# Patient Record
Sex: Male | Born: 1965 | Race: White | Hispanic: No | Marital: Single | Smoking: Former smoker
Health system: Southern US, Community
[De-identification: ages and names within clinical notes are randomized; demographics above are authoritative.]

## PROBLEM LIST (undated history)

## (undated) DIAGNOSIS — K219 Gastro-esophageal reflux disease without esophagitis: Secondary | ICD-10-CM

## (undated) DIAGNOSIS — K56609 Unspecified intestinal obstruction, unspecified as to partial versus complete obstruction: Secondary | ICD-10-CM

## (undated) DIAGNOSIS — G4733 Obstructive sleep apnea (adult) (pediatric): Secondary | ICD-10-CM

## (undated) DIAGNOSIS — E781 Pure hyperglyceridemia: Secondary | ICD-10-CM

## (undated) DIAGNOSIS — K529 Noninfective gastroenteritis and colitis, unspecified: Secondary | ICD-10-CM

## (undated) DIAGNOSIS — I10 Essential (primary) hypertension: Secondary | ICD-10-CM

## (undated) DIAGNOSIS — Z9989 Dependence on other enabling machines and devices: Secondary | ICD-10-CM

## (undated) DIAGNOSIS — E119 Type 2 diabetes mellitus without complications: Secondary | ICD-10-CM

## (undated) DIAGNOSIS — J45909 Unspecified asthma, uncomplicated: Secondary | ICD-10-CM

## (undated) DIAGNOSIS — M1990 Unspecified osteoarthritis, unspecified site: Secondary | ICD-10-CM

## (undated) HISTORY — PX: HERNIA REPAIR: SHX51

## (undated) HISTORY — PX: LAPAROSCOPIC INCISIONAL / UMBILICAL / VENTRAL HERNIA REPAIR: SUR789

## (undated) HISTORY — PX: LIPOMA EXCISION: SHX5283

## (undated) HISTORY — DX: Unspecified intestinal obstruction, unspecified as to partial versus complete obstruction: K56.609

## (undated) HISTORY — DX: Pure hyperglyceridemia: E78.1

## (undated) HISTORY — DX: Essential (primary) hypertension: I10

## (undated) HISTORY — DX: Gastro-esophageal reflux disease without esophagitis: K21.9

## (undated) HISTORY — DX: Noninfective gastroenteritis and colitis, unspecified: K52.9

## (undated) HISTORY — DX: Morbid (severe) obesity due to excess calories: E66.01

## (undated) HISTORY — PX: WISDOM TOOTH EXTRACTION: SHX21

---

## 1999-01-10 ENCOUNTER — Ambulatory Visit: Admission: RE | Admit: 1999-01-10 | Discharge: 1999-01-10 | Payer: Self-pay | Admitting: Pulmonary Disease

## 1999-11-10 ENCOUNTER — Ambulatory Visit: Admission: RE | Admit: 1999-11-10 | Discharge: 1999-11-10 | Payer: Self-pay | Admitting: Pulmonary Disease

## 2003-07-27 HISTORY — PX: LAPAROSCOPIC CHOLECYSTECTOMY: SUR755

## 2003-08-03 ENCOUNTER — Emergency Department (HOSPITAL_COMMUNITY): Admission: EM | Admit: 2003-08-03 | Discharge: 2003-08-03 | Payer: Self-pay | Admitting: Emergency Medicine

## 2003-08-19 ENCOUNTER — Observation Stay (HOSPITAL_COMMUNITY): Admission: RE | Admit: 2003-08-19 | Discharge: 2003-08-20 | Payer: Self-pay | Admitting: *Deleted

## 2003-08-19 ENCOUNTER — Encounter (INDEPENDENT_AMBULATORY_CARE_PROVIDER_SITE_OTHER): Payer: Self-pay | Admitting: Specialist

## 2003-10-07 ENCOUNTER — Emergency Department (HOSPITAL_COMMUNITY): Admission: AD | Admit: 2003-10-07 | Discharge: 2003-10-07 | Payer: Self-pay | Admitting: Family Medicine

## 2004-11-18 ENCOUNTER — Ambulatory Visit: Payer: Self-pay | Admitting: Internal Medicine

## 2005-07-21 ENCOUNTER — Ambulatory Visit: Payer: Self-pay | Admitting: Internal Medicine

## 2005-09-28 ENCOUNTER — Ambulatory Visit: Payer: Self-pay | Admitting: Internal Medicine

## 2005-11-15 ENCOUNTER — Emergency Department (HOSPITAL_COMMUNITY): Admission: EM | Admit: 2005-11-15 | Discharge: 2005-11-15 | Payer: Self-pay | Admitting: Family Medicine

## 2006-01-31 ENCOUNTER — Emergency Department (HOSPITAL_COMMUNITY): Admission: EM | Admit: 2006-01-31 | Discharge: 2006-01-31 | Payer: Self-pay | Admitting: Emergency Medicine

## 2006-04-05 ENCOUNTER — Ambulatory Visit: Payer: Self-pay | Admitting: Internal Medicine

## 2006-05-09 ENCOUNTER — Emergency Department (HOSPITAL_COMMUNITY): Admission: EM | Admit: 2006-05-09 | Discharge: 2006-05-09 | Payer: Self-pay | Admitting: Family Medicine

## 2007-07-31 ENCOUNTER — Telehealth (INDEPENDENT_AMBULATORY_CARE_PROVIDER_SITE_OTHER): Payer: Self-pay | Admitting: *Deleted

## 2007-08-23 ENCOUNTER — Ambulatory Visit: Payer: Self-pay | Admitting: Internal Medicine

## 2007-08-23 DIAGNOSIS — I10 Essential (primary) hypertension: Secondary | ICD-10-CM

## 2007-08-23 LAB — CONVERTED CEMR LAB
Bilirubin Urine: NEGATIVE
Blood in Urine, dipstick: NEGATIVE
Glucose, Urine, Semiquant: NEGATIVE
Ketones, urine, test strip: NEGATIVE
Nitrite: NEGATIVE
Protein, U semiquant: NEGATIVE
Specific Gravity, Urine: 1.025
Urobilinogen, UA: NEGATIVE
WBC Urine, dipstick: NEGATIVE
pH: 6

## 2007-08-24 LAB — CONVERTED CEMR LAB
BUN: 11 mg/dL (ref 6–23)
Creatinine, Ser: 1 mg/dL (ref 0.4–1.5)
PSA: 0.67 ng/mL (ref 0.10–4.00)
Potassium: 4.3 meq/L (ref 3.5–5.1)

## 2007-08-25 ENCOUNTER — Encounter (INDEPENDENT_AMBULATORY_CARE_PROVIDER_SITE_OTHER): Payer: Self-pay | Admitting: *Deleted

## 2007-09-15 ENCOUNTER — Inpatient Hospital Stay (HOSPITAL_COMMUNITY): Admission: EM | Admit: 2007-09-15 | Discharge: 2007-09-17 | Payer: Self-pay | Admitting: Emergency Medicine

## 2007-09-15 ENCOUNTER — Telehealth (INDEPENDENT_AMBULATORY_CARE_PROVIDER_SITE_OTHER): Payer: Self-pay | Admitting: *Deleted

## 2007-09-15 ENCOUNTER — Ambulatory Visit: Payer: Self-pay | Admitting: Internal Medicine

## 2007-09-19 ENCOUNTER — Encounter (HOSPITAL_COMMUNITY): Admission: RE | Admit: 2007-09-19 | Discharge: 2007-10-03 | Payer: Self-pay | Admitting: *Deleted

## 2008-10-08 ENCOUNTER — Ambulatory Visit: Payer: Self-pay | Admitting: Internal Medicine

## 2008-10-08 DIAGNOSIS — R351 Nocturia: Secondary | ICD-10-CM | POA: Insufficient documentation

## 2008-10-08 DIAGNOSIS — E8881 Metabolic syndrome: Secondary | ICD-10-CM

## 2008-10-08 DIAGNOSIS — K5289 Other specified noninfective gastroenteritis and colitis: Secondary | ICD-10-CM

## 2008-10-08 LAB — CONVERTED CEMR LAB
Bilirubin Urine: NEGATIVE
Blood in Urine, dipstick: NEGATIVE
Glucose, Urine, Semiquant: NEGATIVE
Ketones, urine, test strip: NEGATIVE
Nitrite: NEGATIVE
Protein, U semiquant: NEGATIVE
Specific Gravity, Urine: 1.015
Urobilinogen, UA: 0.2
WBC Urine, dipstick: NEGATIVE
pH: 6

## 2009-02-06 ENCOUNTER — Telehealth (INDEPENDENT_AMBULATORY_CARE_PROVIDER_SITE_OTHER): Payer: Self-pay | Admitting: *Deleted

## 2009-02-07 ENCOUNTER — Ambulatory Visit: Payer: Self-pay | Admitting: Internal Medicine

## 2009-02-07 ENCOUNTER — Encounter (INDEPENDENT_AMBULATORY_CARE_PROVIDER_SITE_OTHER): Payer: Self-pay | Admitting: *Deleted

## 2009-02-07 DIAGNOSIS — N453 Epididymo-orchitis: Secondary | ICD-10-CM | POA: Insufficient documentation

## 2009-07-22 ENCOUNTER — Ambulatory Visit: Payer: Self-pay | Admitting: Internal Medicine

## 2009-07-22 ENCOUNTER — Encounter (INDEPENDENT_AMBULATORY_CARE_PROVIDER_SITE_OTHER): Payer: Self-pay | Admitting: *Deleted

## 2009-07-22 ENCOUNTER — Telehealth: Payer: Self-pay | Admitting: Internal Medicine

## 2009-07-22 DIAGNOSIS — R1033 Periumbilical pain: Secondary | ICD-10-CM | POA: Insufficient documentation

## 2009-07-22 DIAGNOSIS — K219 Gastro-esophageal reflux disease without esophagitis: Secondary | ICD-10-CM

## 2009-07-22 LAB — CONVERTED CEMR LAB
Bilirubin Urine: NEGATIVE
Blood in Urine, dipstick: NEGATIVE
Glucose, Urine, Semiquant: NEGATIVE
Ketones, urine, test strip: NEGATIVE
Nitrite: NEGATIVE
Protein, U semiquant: NEGATIVE
Specific Gravity, Urine: 1.01
Urobilinogen, UA: 0.2
WBC Urine, dipstick: NEGATIVE
pH: 6.5

## 2009-07-23 LAB — CONVERTED CEMR LAB
ALT: 33 units/L (ref 0–53)
AST: 19 units/L (ref 0–37)
Albumin: 3.8 g/dL (ref 3.5–5.2)
Alkaline Phosphatase: 58 units/L (ref 39–117)
Amylase: 24 units/L — ABNORMAL LOW (ref 27–131)
BUN: 10 mg/dL (ref 6–23)
Basophils Absolute: 0 10*3/uL (ref 0.0–0.1)
Basophils Relative: 0.4 % (ref 0.0–3.0)
Bilirubin, Direct: 0.1 mg/dL (ref 0.0–0.3)
Creatinine, Ser: 0.8 mg/dL (ref 0.4–1.5)
Eosinophils Absolute: 0.2 10*3/uL (ref 0.0–0.7)
Eosinophils Relative: 3.6 % (ref 0.0–5.0)
HCT: 45.9 % (ref 39.0–52.0)
Hemoglobin: 15.2 g/dL (ref 13.0–17.0)
Hgb A1c MFr Bld: 6.1 % (ref 4.6–6.5)
Lipase: 5 units/L — ABNORMAL LOW (ref 11.0–59.0)
Lymphocytes Relative: 31.3 % (ref 12.0–46.0)
Lymphs Abs: 2 10*3/uL (ref 0.7–4.0)
MCHC: 33.1 g/dL (ref 30.0–36.0)
MCV: 90 fL (ref 78.0–100.0)
Monocytes Absolute: 0.7 10*3/uL (ref 0.1–1.0)
Monocytes Relative: 11.1 % (ref 3.0–12.0)
Neutro Abs: 3.6 10*3/uL (ref 1.4–7.7)
Neutrophils Relative %: 53.6 % (ref 43.0–77.0)
Platelets: 190 10*3/uL (ref 150.0–400.0)
Potassium: 3.7 meq/L (ref 3.5–5.1)
RBC: 5.1 M/uL (ref 4.22–5.81)
RDW: 12.2 % (ref 11.5–14.6)
Total Bilirubin: 0.8 mg/dL (ref 0.3–1.2)
Total Protein: 6.8 g/dL (ref 6.0–8.3)
WBC: 6.5 10*3/uL (ref 4.5–10.5)

## 2009-07-24 ENCOUNTER — Encounter (INDEPENDENT_AMBULATORY_CARE_PROVIDER_SITE_OTHER): Payer: Self-pay | Admitting: *Deleted

## 2009-07-30 ENCOUNTER — Ambulatory Visit: Payer: Self-pay | Admitting: Internal Medicine

## 2009-07-30 LAB — CONVERTED CEMR LAB
OCCULT 1: NEGATIVE
OCCULT 2: NEGATIVE
OCCULT 3: NEGATIVE

## 2009-07-31 ENCOUNTER — Encounter (INDEPENDENT_AMBULATORY_CARE_PROVIDER_SITE_OTHER): Payer: Self-pay | Admitting: *Deleted

## 2009-09-19 ENCOUNTER — Ambulatory Visit: Payer: Self-pay | Admitting: Family Medicine

## 2009-09-22 LAB — CONVERTED CEMR LAB
ALT: 33 units/L (ref 0–53)
AST: 16 units/L (ref 0–37)
Albumin: 4 g/dL (ref 3.5–5.2)
Alkaline Phosphatase: 64 units/L (ref 39–117)
Amylase: 21 units/L (ref 0–105)
BUN: 11 mg/dL (ref 6–23)
Basophils Absolute: 0 10*3/uL (ref 0.0–0.1)
Basophils Relative: 0 % (ref 0–1)
CO2: 22 meq/L (ref 19–32)
Calcium: 8.6 mg/dL (ref 8.4–10.5)
Chloride: 104 meq/L (ref 96–112)
Creatinine, Ser: 0.96 mg/dL (ref 0.40–1.50)
Eosinophils Absolute: 0.3 10*3/uL (ref 0.0–0.7)
Eosinophils Relative: 5 % (ref 0–5)
Glucose, Bld: 146 mg/dL — ABNORMAL HIGH (ref 70–99)
HCT: 44.9 % (ref 39.0–52.0)
Hemoglobin: 14.7 g/dL (ref 13.0–17.0)
Lipase: 13 units/L (ref 0–75)
Lymphocytes Relative: 26 % (ref 12–46)
Lymphs Abs: 1.8 10*3/uL (ref 0.7–4.0)
MCHC: 32.7 g/dL (ref 30.0–36.0)
MCV: 88.7 fL (ref 78.0–100.0)
Monocytes Absolute: 0.4 10*3/uL (ref 0.1–1.0)
Monocytes Relative: 6 % (ref 3–12)
Neutro Abs: 4.2 10*3/uL (ref 1.7–7.7)
Neutrophils Relative %: 62 % (ref 43–77)
Platelets: 192 10*3/uL (ref 150–400)
Potassium: 3.7 meq/L (ref 3.5–5.3)
RBC: 5.06 M/uL (ref 4.22–5.81)
RDW: 13.3 % (ref 11.5–15.5)
Sodium: 139 meq/L (ref 135–145)
Total Bilirubin: 0.5 mg/dL (ref 0.3–1.2)
Total Protein: 6.5 g/dL (ref 6.0–8.3)
WBC: 6.8 10*3/uL (ref 4.0–10.5)

## 2009-11-10 ENCOUNTER — Ambulatory Visit: Payer: Self-pay | Admitting: Family Medicine

## 2009-11-11 ENCOUNTER — Ambulatory Visit: Payer: Self-pay | Admitting: Cardiovascular Disease

## 2009-11-11 ENCOUNTER — Telehealth (INDEPENDENT_AMBULATORY_CARE_PROVIDER_SITE_OTHER): Payer: Self-pay | Admitting: *Deleted

## 2009-11-11 DIAGNOSIS — K429 Umbilical hernia without obstruction or gangrene: Secondary | ICD-10-CM

## 2009-11-11 DIAGNOSIS — K4090 Unilateral inguinal hernia, without obstruction or gangrene, not specified as recurrent: Secondary | ICD-10-CM | POA: Insufficient documentation

## 2009-11-11 LAB — CONVERTED CEMR LAB
ALT: 30 units/L (ref 0–53)
AST: 19 units/L (ref 0–37)
Albumin: 4 g/dL (ref 3.5–5.2)
Alkaline Phosphatase: 59 units/L (ref 39–117)
Amylase: 38 units/L (ref 27–131)
BUN: 12 mg/dL (ref 6–23)
Basophils Absolute: 0 10*3/uL (ref 0.0–0.1)
Basophils Relative: 0.2 % (ref 0.0–3.0)
Bilirubin, Direct: 0.1 mg/dL (ref 0.0–0.3)
CO2: 29 meq/L (ref 19–32)
Calcium: 9 mg/dL (ref 8.4–10.5)
Chloride: 104 meq/L (ref 96–112)
Creatinine, Ser: 0.9 mg/dL (ref 0.4–1.5)
Eosinophils Absolute: 0.3 10*3/uL (ref 0.0–0.7)
Eosinophils Relative: 3.9 % (ref 0.0–5.0)
GFR calc non Af Amer: 98.38 mL/min (ref >60)
Glucose, Bld: 97 mg/dL (ref 70–99)
H Pylori IgG: NEGATIVE
HCT: 45.3 % (ref 39.0–52.0)
Hemoglobin: 15.5 g/dL (ref 13.0–17.0)
Lipase: 15 units/L (ref 11.0–59.0)
Lymphocytes Relative: 26.6 % (ref 12.0–46.0)
Lymphs Abs: 2.3 10*3/uL (ref 0.7–4.0)
MCHC: 34.3 g/dL (ref 30.0–36.0)
MCV: 88.6 fL (ref 78.0–100.0)
Monocytes Absolute: 0.3 10*3/uL (ref 0.1–1.0)
Monocytes Relative: 2.9 % — ABNORMAL LOW (ref 3.0–12.0)
Neutro Abs: 5.8 10*3/uL (ref 1.4–7.7)
Neutrophils Relative %: 66.4 % (ref 43.0–77.0)
Platelets: 189 10*3/uL (ref 150.0–400.0)
Potassium: 4.1 meq/L (ref 3.5–5.1)
RBC: 5.11 M/uL (ref 4.22–5.81)
RDW: 13.1 % (ref 11.5–14.6)
Sodium: 141 meq/L (ref 135–145)
TSH: 2.9 microintl units/mL (ref 0.35–5.50)
Total Bilirubin: 0.5 mg/dL (ref 0.3–1.2)
Total Protein: 6.9 g/dL (ref 6.0–8.3)
WBC: 8.8 10*3/uL (ref 4.5–10.5)

## 2009-11-12 ENCOUNTER — Encounter: Payer: Self-pay | Admitting: Family Medicine

## 2009-11-12 ENCOUNTER — Telehealth: Payer: Self-pay | Admitting: Family Medicine

## 2009-11-21 ENCOUNTER — Encounter: Payer: Self-pay | Admitting: Family Medicine

## 2009-12-23 ENCOUNTER — Encounter: Payer: Self-pay | Admitting: Family Medicine

## 2010-06-12 ENCOUNTER — Ambulatory Visit: Payer: Self-pay | Admitting: Internal Medicine

## 2010-08-25 NOTE — Letter (Signed)
Summary: Out of Work  [facility] [employer]  [address] [place], [place] [address]   Phone: [phone]  Fax: [phone]    November 10, 2009   Employee:  Scott Avery [facility]    To Whom It May Concern:   For Medical reasons, please excuse the above named employee from work for the following dates:  Start:   11/10/2009  End:   11/11/2009  If you need additional information, please feel free to contact our office.         Sincerely,    Loreen Freud DO

## 2010-08-25 NOTE — Assessment & Plan Note (Signed)
Summary: abdoninal pain/cbs   Vital Signs:  Patient profile:   45 year old male Height:      67 inches Weight:      345 pounds BMI:     54.23 Pulse rate:   86 / minute Pulse rhythm:   regular BP sitting:   146 / 82  (left arm) Cuff size:   large  Vitals Entered By: Army Fossa CMA (November 10, 2009 2:36 PM) CC: Pt here for abdominal pain- bloating, vomitted twice, having diarrhea. Pt was seen for the same thing in Feb., Abdominal Pain   History of Present Illness:       This is a 45 year old man who presents with Abdominal Pain.  The symptoms began 3 days ago.  Pt here c/o return of abd pain and having N/V/D for 3 days.  Pt on omeprazole two times a day ---no relief.    "Greens"  seem to make it worse. Pork also seems to make it worse.   Pt no longer has a GB.   .  The patient reports vomiting and diarrhea, but denies constipation, melena, hematochezia, anorexia, and hematemesis.  The location of the pain is epigastric.  The pain is described as intermittent and cramping in quality.  The patient denies the following symptoms: fever, weight loss, dysuria, chest pain, jaundice, dark urine, missed menstrual period, and vaginal bleeding.  The pain is worse with food.  This is the 3rd visit for the same thing.     Current Medications (verified): 1)  Labetalol Hcl 300 Mg Tabs (Labetalol Hcl) .Marland Kitchen.. 1 By Mouth Two Times A Day 2)  Prilosec Otc 20 Mg  Tbec (Omeprazole Magnesium) .... 2 Tabs By Mouth Daily 3)  Amlodipine Besylate 5 Mg Tabs (Amlodipine Besylate) .Marland Kitchen.. 1 Once Daily 4)  Clidinium-Chlordiazepoxide 2.5-5 Mg Caps (Clidinium-Chlordiazepoxide) .Marland Kitchen.. 1 Q 6 Hrs As Needed Pain 5)  Promethazine Hcl 25 Mg Tabs (Promethazine Hcl) .Marland Kitchen.. 1 By Mouth Qid As Needed  Allergies (verified): No Known Drug Allergies  Past History:  Past Medical History: Last updated: 07/22/2009 Metabolic Syndrome  GERD Hypertension  Past Surgical History: Last updated: 07/22/2009 Cholecystectomy  Family  History: Last updated: 07/22/2009 PGF prostate CA; no FH of GI disease  Social History: Last updated: 07/22/2009 Divorced Current Smoker: 1/2 ppd Alcohol use-yes: rarely Regular exercise-no Occupation: Drive Mining engineer  Risk Factors: Exercise: no (07/22/2009)  Risk Factors: Smoking Status: current (07/22/2009)  Family History: Reviewed history from 07/22/2009 and no changes required. PGF prostate CA; no FH of GI disease  Social History: Reviewed history from 07/22/2009 and no changes required. Divorced Current Smoker: 1/2 ppd Alcohol use-yes: rarely Regular exercise-no Occupation: Drive Mining engineer  Review of Systems      See HPI  Physical Exam  General:  Well-developed,well-nourished,in no acute distress; alert,appropriate and cooperative throughout examination Mouth:  pharynx pink and moist.   Lungs:  Normal respiratory effort, chest expands symmetrically. Lungs are clear to auscultation, no crackles or wheezes. Heart:  normal rate and no murmur.   Abdomen:  + periumbilical tenderness + umbilical hernia and ventral hernia soft, normal bowel sounds, no distention, no masses, no guarding, and no rebound tenderness.   Psych:  Oriented X3 and normally interactive.     Impression & Recommendations:  Problem # 1:  ABDOMINAL PAIN, PERIUMBILICAL (ICD-789.05) phenergan for nausea/ vomiting go to er if symptoms worsen tonight will get CT con't omeprazole Orders: Venipuncture (73710) TLB-BMP (Basic Metabolic Panel-BMET) (80048-METABOL) TLB-CBC Platelet - w/Differential (85025-CBCD)  TLB-Hepatic/Liver Function Pnl (80076-HEPATIC) TLB-TSH (Thyroid Stimulating Hormone) (84443-TSH) TLB-Amylase (82150-AMYL) TLB-Lipase (83690-LIPASE) TLB-H. Pylori Abs(Helicobacter Pylori) (86677-HELICO) Radiology Referral (Radiology)  Discussed symptom control with the patient.   Complete Medication List: 1)  Labetalol Hcl 300 Mg Tabs (Labetalol hcl) .Marland Kitchen.. 1 by mouth two  times a day 2)  Prilosec Otc 20 Mg Tbec (Omeprazole magnesium) .... 2 tabs by mouth daily 3)  Amlodipine Besylate 5 Mg Tabs (Amlodipine besylate) .Marland Kitchen.. 1 once daily 4)  Clidinium-chlordiazepoxide 2.5-5 Mg Caps (Clidinium-chlordiazepoxide) .Marland Kitchen.. 1 q 6 hrs as needed pain 5)  Promethazine Hcl 25 Mg Tabs (Promethazine hcl) .Marland Kitchen.. 1 by mouth qid as needed Prescriptions: PROMETHAZINE HCL 25 MG TABS (PROMETHAZINE HCL) 1 by mouth qid as needed  #30 x 0   Entered and Authorized by:   Loreen Freud DO   Signed by:   Loreen Freud DO on 11/10/2009   Method used:   [phone][facility] (retail[facility]       [address]       [place], [place]  [address]       Ph: [phone] or [phone]       Fax: [phone]   RxID:   [id number]

## 2010-08-25 NOTE — Assessment & Plan Note (Signed)
Summary: congested/cbs   Vital Signs:  Patient profile:   45 year old male Weight:      348.4 pounds BMI:     54.76 Temp:     98.7 degrees F oral Pulse rate:   76 / minute Resp:     15 per minute BP sitting:   148 / 88  (left arm) Cuff size:   large  Vitals Entered By: Shonna Chock CMA (June 12, 2010 3:33 PM) CC: Congested, fatigue, sinus pressure, stuffy, and left ear concerns since tuesday , URI symptoms   Primary Care [facility]:  Marga Melnick MD  CC:  Congested, fatigue, sinus pressure, stuffy, and left ear concerns since tuesday , and URI symptoms.  History of Present Illness:  RTI Symptoms      This is a 45 year old man who presents with RTI  symptoms ; onset  06/08/2010 as  myalgias & fatigue.  The patient reports nasal congestion, purulent nasal discharge, and dry cough, but denies sore throat and earache.  Associated symptoms include low-grade fever (<100.5 degrees).  The patient denies dyspnea and wheezing.   The patient denies headache.  Risk factors for Strep sinusitis include bilateral facial pain.  The patient denies the following risk factors for Strep sinusitis: tooth pain and tender adenopathy.  Rx: Mucinex DM  Current Medications (verified): 1)  Labetalol Hcl 300 Mg Tabs (Labetalol Hcl) .Marland Kitchen.. 1 By Mouth Two Times A Day 2)  Prilosec Otc 20 Mg  Tbec (Omeprazole Magnesium) .Marland Kitchen.. 1 Tabs By Mouth Daily 3)  Amlodipine Besylate 5 Mg Tabs (Amlodipine Besylate) .Marland Kitchen.. 1 Once Daily  Allergies (verified): No Known Drug Allergies  Physical Exam  General:  in no acute distress; alert,appropriate and cooperative throughout examination Ears:  External ear exam shows no significant lesions or deformities.  Otoscopic examination reveals clear canals, tympanic membranes are intact bilaterally without bulging, retraction, inflammation or discharge. Hearing is grossly normal bilaterally. Nose:  External nasal examination shows no deformity or inflammation. Nasal mucosa are   erythematous  without lesions or exudates. Septum to R . Hyponasal  Mouth:  Oral mucosa and oropharynx without lesions or exudates.  Teeth in good repair. Marked pharyngeal erythema.   Lungs:  Normal respiratory effort, chest expands symmetrically. Lungs are clear to auscultation, no crackles or wheezes. Cervical Nodes:  No lymphadenopathy noted Axillary Nodes:  No palpable lymphadenopathy   Impression & Recommendations:  Problem # 1:  SINUSITIS- ACUTE-NOS (ICD-461.9)  His updated medication list for this problem includes:    Amoxicillin 500 Mg Caps (Amoxicillin) .Marland Kitchen... 1 three times a day  Complete Medication List: 1)  Labetalol Hcl 300 Mg Tabs (Labetalol hcl) .Marland Kitchen.. 1 by mouth two times a day 2)  Prilosec Otc 20 Mg Tbec (Omeprazole magnesium) .Marland Kitchen.. 1 tabs by mouth daily 3)  Amlodipine Besylate 5 Mg Tabs (Amlodipine besylate) .Marland Kitchen.. 1 once daily 4)  Amoxicillin 500 Mg Caps (Amoxicillin) .Marland Kitchen.. 1 three times a day  Patient Instructions: 1)  Neti pot once daily - two times a day as needed for congestion. 2)  Drink as much NON  dairy  fluid as you can tolerate for the next few days. Prescriptions: AMOXICILLIN 500 MG CAPS (AMOXICILLIN) 1 three times a day  #30 x 0   Entered and Authorized by:   Marga Melnick MD   Signed by:   Marga Melnick MD on 06/12/2010   Method used:   F[facility]ed to ...       [facility] [address] [phone][facility] (  retail)       [address]       [place], [place]  [address]       Ph: [phone] or [phone]       Fax: [phone]   RxID:   [id number]    Orders Added: 1)  Est. Patient Level III [34742]

## 2010-08-25 NOTE — Consult Note (Signed)
Summary: [facility]   Imported By: Lanelle Bal 11/28/2009 11:41:56  _____________________________________________________________________  External Attachment:    Type:   Image     Comment:   External Document

## 2010-08-25 NOTE — Letter (Signed)
Summary: [facility]   Imported By: Lanelle Bal 01/06/2010 14:00:31  _____________________________________________________________________  External Attachment:    Type:   Image     Comment:   External Document

## 2010-08-25 NOTE — Letter (Signed)
Summary: Work Dietitian at [employer]  [address] [place], [place] [address]   Phone: [phone]  Fax: [phone]    Today's Date: November 12, 2009  Name of Patient: Scott Avery  The above named patient had a medical visit today at:  am / pm.  Please take this into consideration when reviewing the time away from work/school.    Special Instructions:  [  ] None  [  ] To be off the remainder of today, returning to the normal work / school schedule tomorrow.  [  ] To be off until the next scheduled appointment on ______________________.  [ X ] Other  The above pt was diagnosed with a hernia and is unable to lift more than 10 lbs until he is seen by the surgeon.  Appointment is being made.   ________________________________________________________________________   Sincerely yours,   Loreen Freud DO

## 2010-08-25 NOTE — Progress Notes (Signed)
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 Fax is  [phone],  attn: Huntley Dec.  Initial call taken by: Harold Barban,  November 12, 2009 10:21 AM  Follow-up for Phone Call        printed Follow-up by: Loreen Freud DO,  November 12, 2009 11:18 AM  Additional Follow-up for Phone Call Additional follow up Details #1::        faxed. Army Fossa CMA  November 12, 2009 11:28 AM

## 2010-08-25 NOTE — Progress Notes (Signed)
Summary: results  Phone Note Outgoing Call Call back at Work Phone [phone] Call back at [phone]   Call placed by: Army Fossa CMA,  November 11, 2009 4:43 PM Reason for Call: Discuss lab or test results Summary of Call: Regarding CT results, LMTCB:  + small hernia--- refer to surgery for evaluation  Follow-up for Phone Call        Pt is aware of referral. Army Fossa CMA  November 11, 2009 5:02 PM     Additional Follow-up for Phone Call Additional follow up Details #2::    Patient is requesting a call back today. Follow-up by: Barb Merino,  November 11, 2009 4:57 PM

## 2010-08-25 NOTE — Letter (Signed)
Summary: Out of Work  [facility]  [address]   [place], [place] [address]   Phone: [phone]  Fax: [phone]    September 19, 2009   Employee:  Scott Avery [facility]    To Whom It May Concern:   For Medical reasons, please excuse the above named employee from work for the following dates:  Start:   Feb 23rd- 24th  End:   Feb 25th  If you need additional information, please feel free to contact our office.         Sincerely,    Seymour Bars DO

## 2010-08-25 NOTE — Assessment & Plan Note (Signed)
Summary: abd pain   Vital Signs:  Patient profile:   45 year old male old male Weight:      343 pounds Pulse rate:   79 / minute BP sitting:   122 / 71  (left arm) Cuff size:   large  Vitals Entered By: Kathlene November (September 19, 2009 8:15 AM) CC: abdominal bloating, cramping, diarrhea and vomiting since Tuesday   Primary Care Provider:  Marga Melnick MD  CC:  abdominal bloating, cramping, and diarrhea and vomiting since Tuesday.  History of Present Illness: 45 yo WM presents for problems with abdominal bloating, cramping, diarrhea, nausea and vomitting that started 3 days ago.  He is starting to get better.  The same thing happened in Dec and he saw Dr Alwyn Ren.  He reports that his diagnosis was pancreatitis then.  He has been trying to lose weight and eat healthier but over the weekend, he was watching Nascar race and has chili beans.    He took one of the pills that Dr Alwyn Ren gave him and he has not been eating.  He has been drinking plenty of fluids.  He started a bland diet last night.  No fevers or chills.  His urine was dark colored for 2 days but has returned to normal.    Current Medications (verified): 1)  Labetalol Hcl 300 Mg Tabs (Labetalol Hcl) .Marland Kitchen.. 1 By Mouth Two Times A Day 2)  Prilosec Otc 20 Mg  Tbec (Omeprazole Magnesium) .Marland Kitchen.. 1 By Mouth Qod 3)  Amlodipine Besylate 5 Mg Tabs (Amlodipine Besylate) .Marland Kitchen.. 1 Once Daily 4)  Clidinium-Chlordiazepoxide 2.5-5 Mg Caps (Clidinium-Chlordiazepoxide) .Marland Kitchen.. 1 Q 6 Hrs As Needed Pain  Allergies (verified): No Known Drug Allergies  Comments:  Nurse/Medical Assistant: The patient's medications and allergies were reviewed with the patient and were updated in the Medication and Allergy Lists. Kathlene November (September 19, 2009 8:16 AM)  Past History:  Past Medical History: Reviewed history from 07/22/2009 and no changes required. Metabolic Syndrome  GERD Hypertension  Past Surgical History: Reviewed history from 07/22/2009 and no  changes required. Cholecystectomy  Social History: Reviewed history from 07/22/2009 and no changes required. Divorced Current Smoker: 1/2 ppd Alcohol use-yes: rarely Regular exercise-no Occupation: Drive Mining engineer  Review of Systems      See HPI  Physical Exam  General:  alert, well-developed, well-nourished, and well-hydrated.  obese in NAD Head:  normocephalic and atraumatic.   Eyes:  conjunctiva clear, non icteric Nose:  no nasal discharge.   Mouth:  pharynx pink and moist.   Neck:  no masses.   Lungs:  Normal respiratory effort, chest expands symmetrically. Lungs are clear to auscultation, no crackles or wheezes. Heart:  Normal rate and regular rhythm. S1 and S2 normal without gallop, murmur, click, rub. S4 Abdomen:  soft with reducible umbilical hernia. periumbilical TTP with vol guarding.  No HSM.  NABS.  No rigidity Extremities:  no LE edema Skin:  color normal.  no jaundice or pallor Cervical Nodes:  No lymphadenopathy noted Psych:  good eye contact, not anxious appearing, and not depressed appearing.     Impression & Recommendations:  Problem # 1:  ABDOMINAL PAIN, PERIUMBILICAL (ICD-789.05) Periumbilical pain with N/V/D x 2 days that is improving.  Similar episode in Dec with workup suspicious for pancreatitis.  DDX includes gastroenteritis, PUD, pancreatitis, biliary colic.  Hemodynamically stable today and not in any acute pain.  I did RF his meds given by Dr Alwyn Ren.   Will obtain labs today to look for  underlying cause.  Bland diet, clear fluids.  Call if not improving by Monday.   Orders: T-Comprehensive Metabolic Panel [phone]) T-CBC w/Diff [phone]) T-Amylase [phone]) T-Lipase [phone])  Complete Medication List: 1)  Labetalol Hcl 300 Mg Tabs (Labetalol hcl) .Marland Kitchen.. 1 by mouth two times a day 2)  Prilosec Otc 20 Mg Tbec (Omeprazole magnesium) .... 2 tabs by mouth daily 3)  Amlodipine Besylate 5 Mg Tabs (Amlodipine besylate) .Marland Kitchen.. 1 once  daily 4)  Clidinium-chlordiazepoxide 2.5-5 Mg Caps (Clidinium-chlordiazepoxide) .Marland Kitchen.. 1 q 6 hrs as needed pain  Patient Instructions: 1)  Labs downstairs today. 2)  Will call you w/ results on Monday. 3)  Stick to a bland diet, plenty of clear fluids. 4)  Use RX meds as needed. 5)  F/U with Dr Alwyn Ren if you get any recurrences.   Prescriptions: CLIDINIUM-CHLORDIAZEPOXIDE 2.5-5 MG CAPS (CLIDINIUM-CHLORDIAZEPOXIDE) 1 q 6 hrs as needed pain  #30 x 0   Entered and Authorized by:   Seymour Bars DO   Signed by:   Seymour Bars DO on 09/19/2009   Method used:   [facility]        [facility] [phone][facility] (retail)       [address]       [place], [place]  [address]       Ph: [phone] or [phone]       Fax: [phone]   RxID:   [id number]

## 2010-08-25 NOTE — Letter (Signed)
Summary: Results Follow up Letter  [facility]  [address] [place], [place] [address]   Phone: [phone]  Fax: [phone]    07/31/2009 MRN: [id number]  EVERTTE SONES [address] [place], [place]  [address]  Dear Mr. Straker,  The following are the results of your recent test(s):  Test         Result    Pap Smear:        Normal _____  Not Normal _____ Comments: ______________________________________________________ Cholesterol: LDL(Bad cholesterol):         Your goal is less than:         HDL (Good cholesterol):       Your goal is more than: Comments:  ______________________________________________________ Mammogram:        Normal _____  Not Normal _____ Comments:  ___________________________________________________________________ Hemoccult:        Normal _X____  Not normal _______ Comments:    _____________________________________________________________________ Other Tests:    We routinely do not discuss normal results over the telephone.  If you desire a copy of the results, or you have any questions about this information we can discuss them at your next office visit.   Sincerely,

## 2010-08-27 ENCOUNTER — Other Ambulatory Visit: Payer: Self-pay | Admitting: Internal Medicine

## 2010-08-27 ENCOUNTER — Ambulatory Visit (INDEPENDENT_AMBULATORY_CARE_PROVIDER_SITE_OTHER): Payer: 59 | Admitting: Internal Medicine

## 2010-08-27 ENCOUNTER — Encounter: Payer: Self-pay | Admitting: Internal Medicine

## 2010-08-27 DIAGNOSIS — R509 Fever, unspecified: Secondary | ICD-10-CM

## 2010-08-27 DIAGNOSIS — M25559 Pain in unspecified hip: Secondary | ICD-10-CM

## 2010-08-27 LAB — CBC WITH DIFFERENTIAL/PLATELET
Eosinophils Absolute: 0.2 10*3/uL (ref 0.0–0.7)
Eosinophils Relative: 3 % (ref 0.0–5.0)
Lymphocytes Relative: 21.8 % (ref 12.0–46.0)
Lymphs Abs: 1.8 10*3/uL (ref 0.7–4.0)
MCHC: 34.7 g/dL (ref 30.0–36.0)
Monocytes Absolute: 0.5 10*3/uL (ref 0.1–1.0)
Monocytes Relative: 5.6 % (ref 3.0–12.0)
Neutro Abs: 5.7 10*3/uL (ref 1.4–7.7)
Neutrophils Relative %: 69.2 % (ref 43.0–77.0)
Platelets: 195 10*3/uL (ref 150.0–400.0)
WBC: 8.2 10*3/uL (ref 4.5–10.5)

## 2010-08-27 LAB — SEDIMENTATION RATE: Sed Rate: 9 mm/hr (ref 0–22)

## 2010-08-28 ENCOUNTER — Ambulatory Visit (HOSPITAL_COMMUNITY)
Admission: RE | Admit: 2010-08-28 | Discharge: 2010-08-28 | Disposition: A | Discharge status: Home / Self Care | Payer: 59 | Source: Ambulatory Visit | Attending: Internal Medicine | Admitting: Internal Medicine

## 2010-08-28 ENCOUNTER — Other Ambulatory Visit: Payer: Self-pay | Admitting: Internal Medicine

## 2010-08-28 DIAGNOSIS — M25551 Pain in right hip: Secondary | ICD-10-CM

## 2010-08-28 DIAGNOSIS — M76899 Other specified enthesopathies of unspecified lower limb, excluding foot: Secondary | ICD-10-CM | POA: Insufficient documentation

## 2010-08-28 DIAGNOSIS — M25559 Pain in unspecified hip: Secondary | ICD-10-CM | POA: Insufficient documentation

## 2010-08-28 DIAGNOSIS — I998 Other disorder of circulatory system: Secondary | ICD-10-CM | POA: Insufficient documentation

## 2010-08-31 ENCOUNTER — Telehealth: Payer: Self-pay | Admitting: Internal Medicine

## 2010-09-02 NOTE — Assessment & Plan Note (Signed)
Summary: Right side hip pain    Vital Signs:  Patient profile:   45 year old male Weight:      335 pounds BMI:     52.66 Temp:     99.2 degrees F oral Pulse rate:   72 / minute Resp:     15 per minute BP sitting:   130 / 88  (left arm) Cuff size:   large  Vitals Entered By: Shonna Chock CMA (August 27, 2010 11:45 AM) CC: Right side hip pain, Lower Extremity Joint pain   Primary Care Provider:  Marga Melnick MD  CC:  Right side hip pain and Lower Extremity Joint pain.  History of Present Illness: Lower Extremity Joint Pain      This is a 45 year old man who presents with Lower Extremity Joint pain.  The patient reports giving away and decreased ROM, but denies swelling, redness, locking, popping, and weakness.  The pain is located in the right hip.  The pain began suddenly and with no injury while walking @ work 08/26/2010.  The pain is described as sharp, intermittent, and activity related , with walking. Standing causes aching discomfort.  The patient denies the following symptoms: fever ( but he has been hot), rash, photosensitivity, eye symptoms, diarrhea, and dysuria.  He  had some nausea with the pain . 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 Rx: ibuprofen 800 mg two times a day 02/01 helped.PGM & P aunt had arthritis.  Current Medications (verified): 1)  Labetalol Hcl 300 Mg Tabs (Labetalol Hcl) .Marland Kitchen.. 1 By Mouth Two Times A Day 2)  Prilosec Otc 20 Mg  Tbec (Omeprazole Magnesium) .Marland Kitchen.. 1 Tabs By Mouth Daily 3)  Amlodipine Besylate 5 Mg Tabs (Amlodipine Besylate) .Marland Kitchen.. 1 Once Daily  Allergies (verified): No Known Drug Allergies  Review of Systems General:  Denies chills and sweats. ENT:  No purulence. Resp:  Denies cough and sputum productive. GU:  Denies discharge, hematuria, and incontinence. Neuro:  Denies brief paralysis, disturbances in coordination, numbness, tingling, and tremors; Itermittent imbalance.  Physical Exam  General:  in no acute distress;  alert,appropriate and cooperative throughout examination Eyes:  No corneal or conjunctival inflammation noted. EOMI. Perrla.  Msk:  No deformity or scoliosis noted of thoracic or lumbar spine.   Minimal asymmetry of thoracic muscles, R > L Pulses:  R and L dorsalis pedis and posterior tibial pulses are full and equal bilaterally Extremities:  No clubbing, cyanosis, edema, or deformity noted with normal full range of motion of all joints.  pain with medial rotation of R hip. Mild crepitus of knees  Neurologic:  alert & oriented X3, strength normal in all extremities, gait (heel/ toe)  normal, and DTRs symmetrical and normal.   Skin:  Intact without suspicious lesions or rashes Cervical Nodes:  No lymphadenopathy noted Axillary Nodes:  No palpable lymphadenopathy Psych:  memory intact for recent and remote, normally interactive, and good eye contact.     Impression & Recommendations:  Problem # 1:  HIP PAIN, RIGHT (ICD-719.45)  Orders: T-Hip Comp Right Min 2 views (73510TC) Venipuncture ([address]) TLB-CBC Platelet - w/Differential (85025-CBCD) TLB-Sedimentation Rate (ESR) (85652-ESR)  His updated medication list for this problem includes:    Tramadol Hcl 50 Mg Tabs (Tramadol hcl) .Marland Kitchen... 1 every 6 hrs as needed for pain  Problem # 2:  FEVER (ICD-780.60)  Orders: T-Hip Comp Right Min 2 views (73510TC) Venipuncture ([address]) TLB-CBC Platelet - w/Differential (85025-CBCD) TLB-Sedimentation Rate (ESR) (85652-ESR)  Complete Medication List: 1)  Labetalol Hcl 300 Mg Tabs (Labetalol hcl) .Marland Kitchen.. 1 by mouth two times a day 2)  Prilosec Otc 20 Mg Tbec (Omeprazole magnesium) .Marland Kitchen.. 1 tabs by mouth daily 3)  Amlodipine Besylate 5 Mg Tabs (Amlodipine besylate) .Marland Kitchen.. 1 once daily 4)  Tramadol Hcl 50 Mg Tabs (Tramadol hcl) .Marland Kitchen.. 1 every 6 hrs as needed for pain  Patient Instructions: 1)  Recommended remaining out of work for  02/02 & 02/03. Prescriptions: TRAMADOL HCL 50 MG TABS (TRAMADOL HCL) 1 every 6  hrs as needed for pain  #30 x 0   Entered and Authorized by:   Marga Melnick MD   Signed by:   Marga Melnick MD on 08/27/2010   Method used:   [facility]        [facility] [phone][facility] (retail)       [address]       [place], [place]  [address]       Ph: [phone] or [phone]       Fax: [phone]   RxID:   [id number]    Orders Added: 1)  Est. Patient Level IV [95638] 2)  T-Hip Comp Right Min 2 views [73510TC] 3)  Venipuncture [75643] 4)  TLB-CBC Platelet - w/Differential [85025-CBCD] 5)  TLB-Sedimentation Rate (ESR) [85652-ESR]

## 2010-09-10 NOTE — Progress Notes (Signed)
Summary: wants to hear about labs and xrays  Phone Note Call from Patient Call back at Home Phone [phone]   Caller: Patient Summary of Call: wants to here about xrays and labs results Initial call taken by: Jerolyn Shin,  August 31, 2010 2:28 PM  Follow-up for Phone Call        Patient notified. Follow-up by: Lucious Groves CMA,  August 31, 2010 2:44 PM

## 2010-10-31 ENCOUNTER — Other Ambulatory Visit: Payer: Self-pay | Admitting: Internal Medicine

## 2010-11-02 NOTE — Telephone Encounter (Signed)
Dr.Hopper please advise, med not on med list in old EMR system, not on inactive med list either. Last OV 08/27/10 (for hip pain)

## 2010-11-02 NOTE — Telephone Encounter (Deleted)
Dr.Hopper please advise, not on active or inactive med list in Old EMR system

## 2010-11-02 NOTE — Telephone Encounter (Signed)
OK X1 # 6

## 2010-12-01 ENCOUNTER — Telehealth: Payer: Self-pay | Admitting: Internal Medicine

## 2010-12-01 ENCOUNTER — Other Ambulatory Visit: Payer: Self-pay

## 2010-12-01 MED ORDER — LABETALOL HCL 300 MG PO TABS: 300.0000 mg | ORAL_TABLET | Freq: Two times a day (BID) | ORAL | Status: DC

## 2010-12-01 MED ORDER — AMLODIPINE BESYLATE 5 MG PO TABS: 5.0000 mg | ORAL_TABLET | Freq: Every day | ORAL | Status: DC

## 2010-12-01 NOTE — Telephone Encounter (Signed)
Please go to chart review, select med tab. Med was sent earlier

## 2010-12-01 NOTE — Telephone Encounter (Signed)
Patient needs refill for labetalol hcl 300 mg- amlopidine 20 mg - [facility] - he said they fax request yesterday

## 2010-12-08 NOTE — H&P (Signed)
NAMEMARCELL, CHAVARIN NO.:  [id number]   MEDICAL RECORD NO.:  [id number]          PATIENT TYPE:  INP   LOCATION:  5532                         FACILITY:  [facility]   PHYSICIAN:  Lowell Bouton, M.D.DATE OF BIRTH:  [birth year]   DATE OF ADMISSION:  09/15/2007  DATE OF DISCHARGE:                              HISTORY & PHYSICAL   CHIEF COMPLAINTS:  Pain and swelling, right ring finger and hand.   HISTORY OF PRESENT ILLNESS:  The patient is a 44 year old right-handed  male who was bitten by a stray cat the day prior to admission.  He went  to see Dr. Alwyn Ren at about 5:00 p.m. and was found to have cellulitis  and an inflamed ring finger PIP joint.  He presents now for evaluation.   PAST MEDICAL HISTORY:  Cholecystectomy.   CURRENT MEDICATIONS:  Labetalol and Prilosec as needed.   ALLERGIES:  HE HAS NO ALLERGIES.   FAMILY HISTORY:  Positive for diabetes and hypertension.   SOCIAL HISTORY:  He smokes a quarter pack of cigarettes a day.  He does  not drink.  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   REVIEW OF SYSTEMS:  Positive for hypertension.  Negative for diabetes  and thyroid disease.   PHYSICAL EXAMINATION:  GENERAL:  He is a well-developed, well-nourished  male in moderate pain.  HEENT:  Pupils are equal, round, and reactive to light.  Nasal septum is  midline.  Oropharynx clear.  External auditory canals are clear.  NECK:  No adenopathy or bruit.  CHEST:  Clear.  HEART:  Regular without murmur.  ABDOMEN:  Soft, nontender, without masses.  EXTREMITIES:  Exam reveals his right hand to have puncture wounds over  the dorsum of the PIP of the ring finger with a mild joint effusion and  tenderness.  There is erythema on the dorsum of his hand with tenderness  along the extensor sheath.  There is no tenderness in the forearm and no  adenopathy in the axilla.   DIAGNOSIS:  Septic arthritis, right ring proximal interphalangeal joint  with  cellulitis, right hand.   PLAN:  The patient will be admitted for elevation, moist heat, and IV  antibiotics.  He will be reassessed in 12 hours to determine whether or  not incision and drainage will be required.      Lowell Bouton, M.D.  Electronically Signed     EMM/MEDQ  D:  09/16/2007  T:  09/17/2007  Job:  [id number]

## 2010-12-08 NOTE — Op Note (Signed)
NAMEMarland Kitchen  Scott Avery, Scott Avery NO.:  [id number]   MEDICAL RECORD NO.:  [id number]          PATIENT TYPE:  INP   LOCATION:  5532                         FACILITY:  [facility]   PHYSICIAN:  Tennis Must Meyerdierks, M.D.DATE OF BIRTH:  1966/05/03   DATE OF PROCEDURE:  09/16/2007  DATE OF DISCHARGE:                               OPERATIVE REPORT   PREOPERATIVE DIAGNOSIS:  Septic arthritis, right ring finger proximal  interphalangeal joint with extensor tenosynovitis.   POSTOPERATIVE DIAGNOSIS:  Septic arthritis, right ring finger proximal  interphalangeal joint with extensor tenosynovitis.   PROCEDURE:  Incision and drainage, right ring finger PIP joint.   SURGEON:  Lowell Bouton, M.D.   ANESTHESIA:  General.   OPERATIVE FINDINGS:  The patient had a cat bite that extended down  through the extensor mechanism into the joint.  There was purulent  material in the subcutaneous tissues.   PROCEDURE:  Under general anesthesia with a tourniquet on the right arm,  the right hand was prepped and draped in the usual fashion.  After  elevating the limb, the tourniquet was inflated to 250 mmHg.   A longitudinal incision was made over the dorsum of the PIP joint just  ulnar to the midline and carried down through the subcutaneous tissues.  Cultures were obtained of the purulent material.  Blunt dissection was  carried down to the extensor mechanism, and this was longitudinally  divided down to the joint.  The joint was then irrigated out with  saline.  The extensor mechanism was irrigated with saline where the  purulent material had tracked proximally.  After completely irrigating  the wound and the joint, the wound was packed open with Iodoform  packing, and 4-0 nylon sutures were placed in the skin edges.  Sterile  dressings were applied.  The patient had the tourniquet released, with  good circulation of the hand.  A 0.5% Marcaine digital block was  inserted for pain  control.   He went to the recovery room awake and in stable and good condition.      Lowell Bouton, M.D.  Electronically Signed     EMM/MEDQ  D:  09/16/2007  T:  09/17/2007  Job:  [id number]   cc:   Titus Dubin. Alwyn Ren, MD,FACP,FCCP

## 2010-12-11 NOTE — Letter (Signed)
April 08, 2006     Alfonse Ras, MD  [id number]  [place], [place] [address]   RE:  ELSON, ULBRICH  MRN:  [id number]  /  DOB:  Nov 27, 1965   Dear Baxter Hire,   Thank you for having seen Kyng Matlock in such a timely fashion.   When I saw him on September 11, he was miserable related to the perirectal  abscess.  He also had a low grade fever up to 99.3.  Blood pressure was  elevated at 150/100 but this may simply reflect pain.  I have asked him to  monitor his blood pressure and his medications will be adjusted if the blood  pressure fails to respond to pain control.   The past history includes cholecystectomy.  He has obstructive sleep apnea.  He has dyslipidemia and metabolic syndrome.   He has no history of methicillin-resistant Staphylococcus aureus.  He has  recently had a cortisone injection of the left heel and concern was that  this skin infections may be related to diabetes.   A CBC and differential were normal.  PT was also normal.  Hemoglobin A1c was  nondiabetic at 5.4.  Potassium, BUN and creatinine were all normal.   His lips have not been dramatically altered and I have encouraged him to  restrict carbs and high fructose corn syrup to prevent regression of his  metabolic syndrome.   He was given a prescription for Augmentin pending your evaluation.   Again, thank you for your evaluation and recommendations.    Sincerely,      Titus Dubin. Alwyn Ren, MD,FACP,FCCP   [facility]/MedQ  DD:  04/08/2006  DT:  04/08/2006  Job #:  [id number]

## 2010-12-11 NOTE — Op Note (Signed)
NAME:  Scott Avery, Scott Avery                        ACCOUNT NO.:  [id number]   MEDICAL RECORD NO.:  [id number]                   PATIENT TYPE:  AMB   LOCATION:  DAY                                  [facility]ACILITY[facility]   PHYSICIAN:  Vikki Ports, M.D.         DATE OF BIRTH:  August 12, 1965   DATE OF PROCEDURE:  08/19/2003  DATE OF DISCHARGE:                                 OPERATIVE REPORT   PREOPERATIVE DIAGNOSIS:  Symptomatic cholelithiasis.   POSTOPERATIVE DIAGNOSIS:  Symptomatic cholelithiasis.   PROCEDURE:  Laparoscopic cholecystectomy.   SURGEON:  Vikki Ports, M.D.   ASSISTANT:  Leonie Man, M.D.   ANESTHESIA:  General.   DESCRIPTION OF PROCEDURE:  The patient was taken to the operating room,  placed in the  supine position after verified time-out identifying the  patient.  General anesthesia was induced. The abdomen was prepped and draped  in normal sterile fashion.  A transverse infraumbilical incision was made,  dissected down to the fascia. Fascia was opened vertically.  Peritoneum was  entered.  An 0-Vicryl pursestring suture was placed around the fascial  defect.  Hasson trocar was placed in the abdomen and the abdomen was  insufflated using continuous flow carbon dioxide to a measurement of 15  mmHg.  Under direct visualization, a 10 mm port was placed in the subxiphoid  region and two 5 mm ports were placed in the right abdomen.   Gallbladder was identified.  It was rather thick-walled and obviously  intrahepatic.  It was retracted cephalad.  Of note,   DICTATION ENDED AT THIS POINT                                               Vikki Ports, M.D.    [facility]/MEDQ  D:  08/19/2003  T:  08/19/2003  Job:  [id number]

## 2010-12-11 NOTE — Op Note (Signed)
NAME:  Scott Avery, Scott Avery                        ACCOUNT NO.:  [id number]   MEDICAL RECORD NO.:  [id number]                   PATIENT TYPE:  AMB   LOCATION:  DAY                                  [facility]ACILITY:  [facility]   PHYSICIAN:  Vikki Ports, M.D.         DATE OF BIRTH:  09-26-1965   DATE OF PROCEDURE:  08/19/2003  DATE OF DISCHARGE:                                 OPERATIVE REPORT   PREOPERATIVE DIAGNOSIS:  Symptomatic cholelithiasis.   POSTOPERATIVE DIAGNOSIS:  Symptomatic cholelithiasis.   PROCEDURE:  Laparoscopic cholecystectomy with intraoperative cholangiogram.   FINDINGS:  Normal cholangiogram, edematous gallbladder.   SURGEON:  Vikki Ports, M.D.   ASSISTANT:  Leonie Man, M.D.   ANESTHESIA:  General.   DESCRIPTION OF PROCEDURE:  The patient was taken to the operating room and  placed in the supine position.  After adequate anesthesia was induced using  endotracheal tube, the abdomen was prepped and draped in the normal sterile  fashion.  Using a transverse infraumbilical incision, I dissected down to  the fascia.  The fascia was opened vertically.  An 0 Vicryl pursestring  suture was placed around the fascial defect.  A Hasson trocar was placed in  the abdomen, and the abdomen was insufflated to 15 mmHg with continuous-flow  carbon dioxide.  Under direct visualization, a 10-mm port was placed in the  subxiphoid region, and two 5-mm ports were placed in the right abdomen.  The  gallbladder was identified.  This was quite thick walled and intrahepatic.  It was retracted cephalad.  The infundibulum was retracted laterally.  Tedious dissection was taken now to identify the cystic duct at the triangle  of Calot.  A good window was identified posterior to it.  Its junction with  the gallbladder was easily visualized.  It was clipped at the gallbladder,  and a small ductotomy was made.  Cholangiogram was performed through a 14-  gauge Angiocath in the right  upper quadrant.  This showed no filling defects  and free flow into the duodenum.  The cystic duct was quite long.  It was  triply clipped and divided.  The cystic artery was then identified in a  similar fashion.  Good dissection was maintained around it.  It was triply  clipped and divided.  The gallbladder was taken off the gallbladder bed  using Bovie electrocautery  without difficulty.  There was a small rent made  in the back wall of the gallbladder with minimal leakage of bile.  It was  placed in an EndoCatch bag and removed through the umbilical port.  Adequate  hemostasis was insured.  The infraumbilical fascial defect was closed with  the 0 Vicryl pursestring suture.  Skin incisions were closed with  subcuticular 4-0 Monocryl.  Steri-Strips and sterile dressings were applied.  All incisions were injected using 0.5% Marcaine.  The patient tolerated the  procedure well and went to PACU in good condition.  Vikki Ports, M.D.    [facility]/MEDQ  D:  08/19/2003  T:  08/19/2003  Job:  [id number]

## 2010-12-15 ENCOUNTER — Encounter: Payer: Self-pay | Admitting: Internal Medicine

## 2010-12-15 ENCOUNTER — Ambulatory Visit (INDEPENDENT_AMBULATORY_CARE_PROVIDER_SITE_OTHER): Payer: 59 | Admitting: Internal Medicine

## 2010-12-15 DIAGNOSIS — F411 Generalized anxiety disorder: Secondary | ICD-10-CM

## 2010-12-15 DIAGNOSIS — D179 Benign lipomatous neoplasm, unspecified: Secondary | ICD-10-CM

## 2010-12-15 MED ORDER — BUPROPION HCL ER (SR) 150 MG PO TB12: 150.0000 mg | ORAL_TABLET | Freq: Two times a day (BID) | ORAL | Status: AC

## 2010-12-15 NOTE — Patient Instructions (Signed)
Assess response to Wellbutrin 150. Please consider Counselling as discussed

## 2010-12-15 NOTE — Progress Notes (Signed)
  Subjective:    Patient ID: Scott Avery, male    DOB: May 23, 1966, 45 y.o.   MRN: [id number]  HPI Onset:lump enlarging L forearm Trigger/injury:weight loss of 7# with emotional issues (see below) Pain, redness swelling:now tender & slightly red Constitutional: Fever, chills, sweats, weight change:no Heme: Abnormal bruising or clotting, lymphadenopathy:no Treatment/response:NSAIDS & ASA for tendernexss   Onset:since 09/2010 Anxiety:related to mother's bipolarity & fiancee's infildelity Depression:/ Loss of interest (Anhedonia):no Panic attacks:no Insomnia: intermittently Anorexia:no Fatigue:in afternoons Neurologic signs/symptoms: Headache, numbness and tingling, weakness:no Endocrinologic signs and symptoms: Hoarseness, weight change, vision change, temperature intolerance, bowel changes, hair,/nail changes:no Family history of mental health issues, alcoholism or drug abuse:se above Medications/efficacy:no    Review of Systems     Objective:   Physical Exam Gen.: In no distress; overweight.  Flat affect but  appropriate and cooperative throughout exam. Head: Normocephalic without obvious abnormalities;   no alopecia  Eyes: No corneal or conjunctival inflammation noted. Pupils equal round reactive to light and accommodation. Extraocular motion intact. Neck: No deformities, masses, or tenderness noted. Range of motion & . Thyroid normal. Lungs: Normal respiratory effort; chest expands symmetrically. Lungs are clear to auscultation without rales, wheezes, or increased work of breathing. Heart: Normal rate and rhythm. Normal S1 and S2. No gallop, click, or rub. S4 w/o  murmur. Abdomen: Bowel sounds normal; abdomen soft and nontender. No masses, organomegaly.Umbilical & ventral  hernias noted.  Vascular: Carotid, radial artery, dorsalis pedis and dorsalis posterior tibial pulses are full and equal. No bruits present. Neurologic: Alert and oriented x3. Deep tendon reflexes  symmetrical and normal.         Skin: Intact without suspicious lesions or rashes.10X 10 mm lipoma L forearm  Which transilluminates Lymph: No cervical, axillary, or epitrochlear  lymphadenopathy present. Psych: Intermittently tearful.Normally interactive                                                                                        Assessment & Plan:  #1 classic lipoma left forearm; no organomegaly or lymphadenopathy.  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  #1 surgical referral, he is afraid that he will injure this lesion at work  #2 generic Wellbutrin  150 mg daily. Counseling recommended if he plans to renew his relationship with his  financee.

## 2011-01-07 NOTE — Telephone Encounter (Signed)
error 

## 2011-02-08 ENCOUNTER — Other Ambulatory Visit (INDEPENDENT_AMBULATORY_CARE_PROVIDER_SITE_OTHER): Payer: Self-pay | Admitting: Surgery

## 2011-02-08 ENCOUNTER — Ambulatory Visit
Admission: RE | Admit: 2011-02-08 | Discharge: 2011-02-08 | Disposition: A | Discharge status: Home / Self Care | Payer: 59 | Source: Ambulatory Visit | Attending: Surgery | Admitting: Surgery

## 2011-02-08 DIAGNOSIS — Z01811 Encounter for preprocedural respiratory examination: Secondary | ICD-10-CM

## 2011-02-08 LAB — COMPREHENSIVE METABOLIC PANEL
ALT: 26 U/L (ref 0–53)
AST: 13 U/L (ref 0–37)
Albumin: 4.1 g/dL (ref 3.5–5.2)
Alkaline Phosphatase: 66 U/L (ref 39–117)
BUN: 10 mg/dL (ref 6–23)
CO2: 26 mEq/L (ref 19–32)
Calcium: 9.5 mg/dL (ref 8.4–10.5)
Chloride: 105 mEq/L (ref 96–112)
Creat: 0.79 mg/dL (ref 0.50–1.35)
Glucose, Bld: 107 mg/dL — ABNORMAL HIGH (ref 70–99)
Potassium: 4 mEq/L (ref 3.5–5.3)
Sodium: 142 mEq/L (ref 135–145)
Total Bilirubin: 0.3 mg/dL (ref 0.3–1.2)
Total Protein: 6.6 g/dL (ref 6.0–8.3)

## 2011-02-08 LAB — CBC WITH DIFFERENTIAL/PLATELET
Basophils Absolute: 0 10*3/uL (ref 0.0–0.1)
Basophils Relative: 0 % (ref 0–1)
Eosinophils Absolute: 0.3 10*3/uL (ref 0.0–0.7)
Eosinophils Relative: 3 % (ref 0–5)
HCT: 44.6 % (ref 39.0–52.0)
Hemoglobin: 15.4 g/dL (ref 13.0–17.0)
Lymphocytes Relative: 20 % (ref 12–46)
Lymphs Abs: 1.7 10*3/uL (ref 0.7–4.0)
MCH: 30.4 pg (ref 26.0–34.0)
MCHC: 34.5 g/dL (ref 30.0–36.0)
MCV: 88.1 fL (ref 78.0–100.0)
Monocytes Absolute: 0.7 10*3/uL (ref 0.1–1.0)
Monocytes Relative: 8 % (ref 3–12)
Neutro Abs: 6 10*3/uL (ref 1.7–7.7)
Neutrophils Relative %: 69 % (ref 43–77)
Platelets: 180 10*3/uL (ref 150–400)
RBC: 5.06 MIL/uL (ref 4.22–5.81)
RDW: 13 % (ref 11.5–15.5)
WBC: 8.7 10*3/uL (ref 4.0–10.5)

## 2011-02-11 DIAGNOSIS — D1801 Hemangioma of skin and subcutaneous tissue: Secondary | ICD-10-CM

## 2011-02-12 ENCOUNTER — Telehealth (INDEPENDENT_AMBULATORY_CARE_PROVIDER_SITE_OTHER): Payer: Self-pay

## 2011-02-12 NOTE — Telephone Encounter (Signed)
Patient called po day 1 with concerns about his surgical area feeling very swollen, tight and warm.  Dr Luisa Hart told him he may have some swelling.  I spoke to Dr Luisa Hart and he advised to ice the area down and make sure he is not having any numbness or decreased movement in that arm.  I notified the pt and he is not having trouble with either of those and will call if no better

## 2011-02-24 ENCOUNTER — Telehealth (INDEPENDENT_AMBULATORY_CARE_PROVIDER_SITE_OTHER): Payer: Self-pay | Admitting: Surgery

## 2011-02-25 ENCOUNTER — Telehealth (INDEPENDENT_AMBULATORY_CARE_PROVIDER_SITE_OTHER): Payer: Self-pay | Admitting: Surgery

## 2011-02-25 NOTE — Telephone Encounter (Signed)
Pt called requesting RTW note for 03/01/11.  Dr. Luisa Hart was paged and okayed RTW note, faxed to employer @ [phone]

## 2011-04-16 LAB — DIFFERENTIAL
Basophils Absolute: 0
Basophils Relative: 0
Eosinophils Absolute: 0.3
Eosinophils Relative: 3
Lymphocytes Relative: 20
Lymphs Abs: 2
Monocytes Absolute: 0.6
Monocytes Relative: 6
Neutro Abs: 6.9
Neutrophils Relative %: 71

## 2011-04-16 LAB — ANAEROBIC CULTURE

## 2011-04-16 LAB — WOUND CULTURE

## 2011-04-16 LAB — CBC
HCT: 43.3
Hemoglobin: 14.9
MCHC: 34.3
MCV: 87.9
Platelets: 187
RBC: 4.93
RDW: 13
WBC: 9.7

## 2011-05-06 ENCOUNTER — Other Ambulatory Visit: Payer: Self-pay | Admitting: Internal Medicine

## 2011-05-06 MED ORDER — LABETALOL HCL 300 MG PO TABS: 300.0000 mg | ORAL_TABLET | Freq: Two times a day (BID) | ORAL | Status: DC

## 2011-05-06 NOTE — Telephone Encounter (Signed)
RX sent

## 2011-06-07 ENCOUNTER — Other Ambulatory Visit: Payer: Self-pay | Admitting: Internal Medicine

## 2011-06-07 MED ORDER — AMLODIPINE BESYLATE 5 MG PO TABS: 5.0000 mg | ORAL_TABLET | Freq: Every day | ORAL | Status: DC

## 2011-06-07 NOTE — Telephone Encounter (Signed)
RX sent

## 2011-10-28 ENCOUNTER — Other Ambulatory Visit: Payer: Self-pay | Admitting: Internal Medicine

## 2011-10-28 NOTE — Telephone Encounter (Signed)
Patient needs to schedule a CPX 11/2011 or after

## 2011-11-27 ENCOUNTER — Other Ambulatory Visit: Payer: Self-pay | Admitting: Internal Medicine

## 2011-11-29 NOTE — Telephone Encounter (Signed)
Patient needs to schedule a CPX  

## 2011-12-30 ENCOUNTER — Other Ambulatory Visit: Payer: Self-pay | Admitting: Internal Medicine

## 2011-12-31 NOTE — Telephone Encounter (Signed)
Patient needs to schedule a CPX  

## 2012-02-07 ENCOUNTER — Other Ambulatory Visit: Payer: Self-pay | Admitting: Internal Medicine

## 2012-02-08 NOTE — Telephone Encounter (Signed)
Patient needs to schedule a CPX  

## 2012-03-02 ENCOUNTER — Other Ambulatory Visit: Payer: Self-pay | Admitting: Internal Medicine

## 2012-04-03 ENCOUNTER — Other Ambulatory Visit: Payer: Self-pay | Admitting: Internal Medicine

## 2012-04-18 ENCOUNTER — Other Ambulatory Visit: Payer: Self-pay | Admitting: Internal Medicine

## 2012-04-19 ENCOUNTER — Telehealth: Payer: Self-pay

## 2012-04-19 NOTE — Telephone Encounter (Signed)
Pt over due for office visit and labs. Plz advise     MW

## 2012-05-17 ENCOUNTER — Other Ambulatory Visit: Payer: Self-pay | Admitting: Internal Medicine

## 2012-07-31 ENCOUNTER — Ambulatory Visit (INDEPENDENT_AMBULATORY_CARE_PROVIDER_SITE_OTHER): Payer: Medicare HMO | Admitting: Internal Medicine

## 2012-07-31 ENCOUNTER — Encounter: Payer: Self-pay | Admitting: Internal Medicine

## 2012-07-31 VITALS — BP 132/88 | HR 83 | Temp 98.4°F | Resp 14 | Ht 71.08 in | Wt 339.8 lb

## 2012-07-31 DIAGNOSIS — Z23 Encounter for immunization: Secondary | ICD-10-CM

## 2012-07-31 DIAGNOSIS — Z0000 Encounter for general adult medical examination without abnormal findings: Secondary | ICD-10-CM

## 2012-07-31 DIAGNOSIS — G4730 Sleep apnea, unspecified: Secondary | ICD-10-CM | POA: Insufficient documentation

## 2012-07-31 DIAGNOSIS — I10 Essential (primary) hypertension: Secondary | ICD-10-CM

## 2012-07-31 MED ORDER — AMLODIPINE BESYLATE 5 MG PO TABS: 5.0000 mg | ORAL_TABLET | Freq: Every day | ORAL | Status: DC

## 2012-07-31 MED ORDER — LABETALOL HCL 300 MG PO TABS: 300.0000 mg | ORAL_TABLET | Freq: Two times a day (BID) | ORAL | Status: DC

## 2012-07-31 NOTE — Patient Instructions (Addendum)
Preventive Health Care: Cardiovascular exercise, this can be as simple a program as walking, is recommended 30-45 minutes 3-4 times per week. If you're not exercising you should take 6-8 weeks to build up to this level.  Eat a low-fat diet with lots of fruits and vegetables, up to 7-9 servings per day.  Consume less than 40 grams of sugar per day from foods & drinks with High Fructose Corn Sugar as #1,2,3 or # 4 on label. Health Care Power of Attorney & Living Will. Complete if not in place ; these place you in charge of your health care decisions. Blood Pressure Goal= AVERAGE < 140/90;  Ideal is an AVERAGE < 135/85. This AVERAGE should be calculated from @ least 5-7 BP readings taken @ different times of day on different days of week. You should not respond to isolated BP readings , but rather the AVERAGE for that week .  To prevent palpitations or premature beats, avoid stimulants such as decongestants, diet pills, nicotine, or caffeine (coffee, tea, cola, or chocolate) to excess.    Please  schedule fasting Labs : BMET,Lipids, hepatic panel, CBC & dif, TSH. PLEASE BRING THESE INSTRUCTIONS TO FOLLOW UP  LAB APPOINTMENT.This will guarantee correct labs are drawn, eliminating need for repeat blood sampling ( needle sticks ! ). Diagnoses /Codes: V70.0   If you activate My Chart; the results can be released to you as soon as they populate from the lab. If you choose not to use this program; the labs have to be reviewed, copied & mailed   causing a delay in getting the results to you.

## 2012-07-31 NOTE — Progress Notes (Signed)
  Subjective:    Patient ID: Scott Avery, male    DOB: 04/28/1966, 47 y.o.   MRN: [id number]  HPI  Mr Scott Avery is here for a physical; he denies acute issues.      Review of Systems CHRONIC HYPERTENSION: Disease Monitoring  Blood pressure range: 139/82-83  Chest pain: no   Dyspnea: no  Claudication: no   Medication compliance: yes  Medication Side Effects  Lightheadedness: no   Urinary frequency: no   Edema: no   Preventitive Healthcare:  Exercise: no   Diet Pattern: decreased fast foods  Salt Restriction: yes       Objective:   Physical Exam Gen.: Healthy and well-nourished in appearance. Alert, appropriate and cooperative throughout exam. Head: Normocephalic without obvious abnormalities; goatee Eyes: No corneal or conjunctival inflammation noted. Pupils equal round reactive to light and accommodation. Fundal exam is benign without hemorrhages, exudate, papilledema. Extraocular motion intact. Vision grossly normal. Ears: External  ear exam reveals no significant lesions or deformities. Canals clear .TMs normal. Hearing is grossly normal bilaterally. Nose: External nasal exam reveals no deformity or inflammation. Nasal mucosa are pink and moist. No lesions or exudates noted.  Mouth: Oral mucosa and oropharynx reveal no lesions or exudates. Teeth in good repair. Neck: No deformities, masses, or tenderness noted. Range of motion & Thyroid normal. Lungs: Normal respiratory effort; chest expands symmetrically. Lungs are clear to auscultation without rales, wheezes, or increased work of breathing. Heart: Normal rate and rhythm. Normal S1 and S2. No gallop, click, or rub. S4 w/o murmur. Abdomen: Bowel sounds normal; abdomen soft and nontender. No masses or organomegaly . Umbilical hernia noted. Genitalia/DRE: Genitalia normal except for left varices. Prostate is normal without enlargement, asymmetry, nodularity, or induration.  Musculoskeletal/extremities: No deformity or  scoliosis noted of  the thoracic or lumbar spine. No clubbing, cyanosis, edema, or deformity noted. Range of motion  normal .Tone & strength  normal.Joints normal. Nail health  good. Vascular: Carotid, radial artery, dorsalis pedis and  posterior tibial pulses are full and equal. No bruits present. Neurologic: Alert and oriented x3. Deep tendon reflexes symmetrical and normal.          Skin: Intact without suspicious lesions or rashes. Lymph: No cervical, axillary, or inguinal lymphadenopathy present. Psych: Mood and affect are normal. Normally interactive                                                                                       Assessment & Plan:  #1 comprehensive physical exam; no acute findings  Plan: see Orders

## 2012-08-01 ENCOUNTER — Other Ambulatory Visit: Payer: Medicare HMO

## 2012-12-07 ENCOUNTER — Ambulatory Visit (INDEPENDENT_AMBULATORY_CARE_PROVIDER_SITE_OTHER): Payer: Managed Care, Other (non HMO) | Admitting: Family Medicine

## 2012-12-07 VITALS — BP 138/85 | HR 79 | Temp 98.6°F | Resp 18 | Ht 71.0 in | Wt 338.0 lb

## 2012-12-07 DIAGNOSIS — L02419 Cutaneous abscess of limb, unspecified: Secondary | ICD-10-CM

## 2012-12-07 MED ORDER — HYDROCODONE-ACETAMINOPHEN 5-325 MG PO TABS: 1.0000 | ORAL_TABLET | Freq: Four times a day (QID) | ORAL | Status: DC | PRN

## 2012-12-07 MED ORDER — DOXYCYCLINE HYCLATE 100 MG PO CAPS: 100.0000 mg | ORAL_CAPSULE | Freq: Two times a day (BID) | ORAL | Status: DC

## 2012-12-07 NOTE — Patient Instructions (Addendum)
RTC immed for any increased pain, redness, swelling.  OK to shower. Change the dressing daily as it will likely continue to leak blood and a small amount of pus.  We will see you back in 2d - on Saturday to take out the packing and likely repack so take the pain medication before you come.  Start the doxycycline tomorrow.  Incision and Drainage Care After Refer to this sheet in the next few weeks. These instructions provide you with information on caring for yourself after your procedure. Your caregiver may also give you more specific instructions. Your treatment has been planned according to current medical practices, but problems sometimes occur. Call your caregiver if you have any problems or questions after your procedure. HOME CARE INSTRUCTIONS   If antibiotic medicine is given, take it as directed. Finish it even if you start to feel better.  Only take over-the-counter or prescription medicines for pain, discomfort, or fever as directed by your caregiver.  Keep all follow-up appointments as directed by your caregiver.  Change any bandages (dressings) as directed by your caregiver. Replace old dressings with clean dressings.  Wash your hands before and after caring for your wound. You will receive specific instructions for cleansing and caring for your wound.  SEEK MEDICAL CARE IF:   You have increased pain, swelling, or redness around the wound.  You have increased drainage, smell, or bleeding from the wound.  You have muscle aches, chills, or you feel generally sick.  You have a fever. MAKE SURE YOU:   Understand these instructions.  Will watch your condition.  Will get help right away if you are not doing well or get worse. Document Released: 10/04/2011 Document Reviewed: 10/04/2011 [facility] Patient Information 2013 [place], [place].  Incision and Drainage of Abscess An abscess (boil or furuncle) is an area infected by germs that contains a collection of pus. Signs and  problems (symptoms) of an abscess include pain, tenderness, redness, or hardness. You may feel a moveable, soft area under your skin. An abscess can occur anywhere in the body. Occasionally, this may spread to surrounding tissues causing cellulitis. Sometimes, a surgeon may make a cut (incision) over your abscess. The pus is drained. Gauze may be packed into the space to provide a drain. Keeping a drain or piece of gauze in the incision keeps the skin from healing first. This helps stop the abscess from forming again. The area may be painful for 5 to 7 days. Most people with an abscess do not have high fevers. If seen early, your abscess may not have localized and may not be cut. If it does not get better on its own or with medicines, you may require another appointment. HOME CARE INSTRUCTIONS   Only take over-the-counter or prescription medicines for pain, discomfort, or fever as directed by your caregiver. Use these only if your caregiver has not given medicines that would interfere.   When you bathe, remove the gauze drain after soaking. You may then wash the wound gently with mild, soapy water. Repack with gauze as your caregiver directs.   See your caregiver as directed for a recheck.   If antibiotics were prescribed, take them as directed.  SEEK MEDICAL CARE IF:   You develop increased pain, swelling, redness, drainage, or bleeding in the wound site.   You develop signs of generalized infection, including muscle aches, chills, or a general ill feeling.   You or your child has an oral temperature above 102 F (38.9 C).  MAKE SURE YOU:   Understand these instructions.   Will watch your condition.   Will get help right away if you are not doing well or get worse.  Document Released: 01/05/2001 Document Revised: 03/24/2011 Document Reviewed: 03/01/2008 [facility] Patient Information 2012 [place], [place].

## 2012-12-07 NOTE — Progress Notes (Signed)
Subjective:    Patient ID: Scott Avery, male    DOB: 10/08/1965, 47 y.o.   MRN: [id number] Chief Complaint  Patient presents with  . Abscess    right groin   HPI  Scott Avery is a pleasant 47 yo who has had several prior abscesses - seem that his whole family gets them.  This one started Sun - about 4d ago.  Not draining - feels like head is underneath skin tab.  Tried to stab it with a pin last night to drain it but was to painful so he tried to convince his wife to use her exacto-knife to lance it but she wouldn't.  Hard to sit - just laying on back with leg open is the only relief he can get.  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  Took ibuprofen 600mg  around 4 hrs ago and now having increasing pain as it is wearing off. No recent antibiotics.   About 7-8 yrs ago had a huge abscess lanced around his anal sphincter which was just miserable.    Past Medical History  Diagnosis Date  . Hypertension   . Sleep apnea    Current Outpatient Prescriptions on File Prior to Visit  Medication Sig Dispense Refill  . amLODipine (NORVASC) 5 MG tablet Take 1 tablet (5 mg total) by mouth daily.  90 tablet  3  . labetalol (NORMODYNE) 300 MG tablet Take 1 tablet (300 mg total) by mouth 2 (two) times daily.  180 tablet  3  . omeprazole (PRILOSEC) 20 MG capsule Take 20 mg by mouth daily.      Marland Kitchen VIAGRA 100 MG tablet TAKE 1/2 TO 1 BY MOUTH AS NEEDED  6 tablet  1   No current facility-administered medications on file prior to visit.   No Known Allergies   Review of Systems  Constitutional: Negative for fever, chills, activity change and appetite change.  Cardiovascular: Negative for leg swelling.  Gastrointestinal: Negative for nausea, vomiting, abdominal pain, diarrhea and constipation.  Genitourinary: Negative for urgency, frequency,  hematuria, decreased urine volume, discharge, penile swelling, scrotal swelling, penile pain and testicular pain.  Musculoskeletal: Positive for myalgias and gait problem. Negative for joint swelling.  Skin: Positive for color change, rash and wound. Negative for pallor.  Neurological: Negative for weakness and numbness.  Hematological: Negative for adenopathy. Does not bruise/bleed easily.  Psychiatric/Behavioral: Positive for sleep disturbance. The patient is nervous/anxious.       BP 138/85  Pulse 79  Temp(Src) 98.6 F (37 C) (Oral)  Resp 18  Ht 5\' 11"  (1.803 m)  Wt 338 lb (153.316 kg)  BMI 47.16 kg/m2 Objective:   Physical Exam  Constitutional: He is oriented to person, place, and time. He appears well-developed and well-nourished. No distress.  obese  HENT:  Head: Normocephalic and atraumatic.  Eyes: No scleral icterus.  Pulmonary/Chest: Effort normal.  Neurological: He is alert and oriented to person, place, and time.  Skin: Skin is warm and dry. Lesion noted. He is not diaphoretic. There is erythema.  Right inner upper thigh with approx 5 in of erythema and warmth with central 3 in dm area of induration with central fluctuance - well-defined and very tender.  Psychiatric: He has a normal mood and affect. His behavior is normal.     Verbal consent contained after  review of risks/benefits of procedures.  Area cleaned with betadine x 2.  Anesthesia with approx 7cc of subcutaneous 2% lidocaine with epi with good result.  Small 1/2 cm pedunculated skin tab over abscess removed w/o comp. 1/2 cm incision made into abscess w/ 11 blade and expressed small amount of purulent drainage - approx 5 cc followed by copious amount of sanguineous drainage. Abscess explored with hemostats and no loculations found. Packed with approx 5 in of iodoform 1/4cm packing gauze and dressed.  Pt tolerated procedure well. Given lortab elixir 15 mL and doxycycline 100mg  po x 1 in office so he can fill his  medications at his pharmacy tomorrow to start. Assessment & Plan:  Cellulitis and abscess of leg - Plan: HYDROcodone-acetaminophen (NORCO/VICODIN) 5-325 MG per tablet, doxycycline (VIBRAMYCIN) 100 MG capsule, Wound culture - ok to shower, change dressing daily, do not pull out packing, recheck in 48 hrs for wound care w/ PA.  Out of work tomorrow.  Meds ordered this encounter  Medications  . HYDROcodone-acetaminophen (NORCO/VICODIN) 5-325 MG per tablet    Sig: Take 1 tablet by mouth every 6 (six) hours as needed for pain.    Dispense:  30 tablet    Refill:  0  . doxycycline (VIBRAMYCIN) 100 MG capsule    Sig: Take 1 capsule (100 mg total) by mouth 2 (two) times daily.    Dispense:  20 capsule    Refill:  0

## 2012-12-09 ENCOUNTER — Encounter: Payer: Self-pay | Admitting: Physician Assistant

## 2012-12-09 ENCOUNTER — Ambulatory Visit (INDEPENDENT_AMBULATORY_CARE_PROVIDER_SITE_OTHER): Payer: Managed Care, Other (non HMO) | Admitting: Physician Assistant

## 2012-12-09 VITALS — BP 158/88 | HR 72 | Temp 98.7°F | Resp 16 | Ht 71.0 in | Wt 337.6 lb

## 2012-12-09 DIAGNOSIS — L02219 Cutaneous abscess of trunk, unspecified: Secondary | ICD-10-CM

## 2012-12-09 DIAGNOSIS — L02214 Cutaneous abscess of groin: Secondary | ICD-10-CM

## 2012-12-09 MED ORDER — CIPROFLOXACIN HCL 250 MG PO TABS: 250.0000 mg | ORAL_TABLET | Freq: Two times a day (BID) | ORAL | Status: DC

## 2012-12-09 NOTE — Progress Notes (Signed)
   Patient ID: Scott Avery MRN: [id number], DOB: [birth year] 47 y.o. Date of Encounter: 12/09/2012, 5:17 PM  Primary Physician: Marga Melnick, MD  Chief Complaint: Wound care   See previous note  HPI: 47 y.o. male presents for wound care s/p I&D on 12/07/12. Doing well No issues or complaints Afebrile/ no chills No nausea or vomiting Tolerating Doxycycline Only took one tab the previous day and has taken one tab so far today Pain improving Has taken one hydrocodone today Daily dressing change Previous note reviewed  Past Medical History  Diagnosis Date  . Hypertension   . Sleep apnea      Home Meds: Prior to Admission medications   Medication Sig Start Date End Date Taking? Authorizing Provider  amLODipine (NORVASC) 5 MG tablet Take 1 tablet (5 mg total) by mouth daily. 07/31/12  Yes Pecola Lawless, MD  doxycycline (VIBRAMYCIN) 100 MG capsule Take 1 capsule (100 mg total) by mouth 2 (two) times daily. 12/07/12  Yes Sherren Mocha, MD  HYDROcodone-acetaminophen (NORCO/VICODIN) 5-325 MG per tablet Take 1 tablet by mouth every 6 (six) hours as needed for pain. 12/07/12  Yes Sherren Mocha, MD  labetalol (NORMODYNE) 300 MG tablet Take 1 tablet (300 mg total) by mouth 2 (two) times daily. 07/31/12  Yes Pecola Lawless, MD  omeprazole (PRILOSEC) 20 MG capsule Take 20 mg by mouth daily.   Yes Historical Provider, MD  VIAGRA 100 MG tablet TAKE 1/2 TO 1 BY MOUTH AS NEEDED 10/31/10  Yes Pecola Lawless, MD    Allergies: No Known Allergies  ROS: Constitutional: Afebrile, no chills Cardiovascular: negative for chest pain or palpitations Dermatological: Positive for wound erythema, pain, and warmth  GI: No nausea or vomiting   EXAM: Physical Exam: Blood pressure 158/88, pulse 72, temperature 98.7 F (37.1 C), temperature source Oral, resp. rate 16, height 5\' 11"  (1.803 m), weight 337 lb 9.6 oz (153.134 kg), SpO2 98.00%., Body mass index is 47.11 kg/(m^2). General: Well developed, well  nourished, in no acute distress. Nontoxic appearing. Head: Normocephalic, atraumatic, sclera non-icteric.  Neck: Supple. Lungs: Breathing is unlabored. Heart: Normal rate. Skin:  Warm and moist. Dressing and packing in place. Local induration, erythema, and tenderness to palpation. Patient's wife reports erythema has improved. Patient reports about 3-4 inches of the packing had slide out this morning so he trimmed this off with scissors.   Neuro: Alert and oriented X 3. Moves all extremities spontaneously. Normal gait.  Psych:  Responds to questions appropriately with a normal affect.   PROCEDURE: Dressing and packing removed. Small amount of serosanguinous fluid mixed with some purulence expressed. Wound bed healthy Irrigated with 1% plain lidocaine 5 cc. Repacked with 1/4 inch plain packing. Dressing applied  LAB: Culture: Preliminary, few GNR's   A/P: 47 y.o. male with cellulitis/abscess as above s/p I&D on 12/07/12 -Wound care per above -Continue Doxycycline -Add Cipro 250 mg 1 po bid #6 no RF secondary to above preliminary culture results until we get back final C and S for improved GNR coverage.  -Advised patient risk of increased diarrhea, take with probiotic  -Pain improving -Daily dressing changes -Recheck 48 hours  Signed, Eula Listen, PA-C 12/09/2012 5:17 PM

## 2012-12-11 ENCOUNTER — Ambulatory Visit (INDEPENDENT_AMBULATORY_CARE_PROVIDER_SITE_OTHER): Payer: Managed Care, Other (non HMO) | Admitting: Physician Assistant

## 2012-12-11 VITALS — BP 138/84 | HR 79 | Temp 98.9°F | Resp 16 | Ht 71.0 in | Wt 337.0 lb

## 2012-12-11 DIAGNOSIS — L03317 Cellulitis of buttock: Secondary | ICD-10-CM

## 2012-12-11 DIAGNOSIS — L0231 Cutaneous abscess of buttock: Secondary | ICD-10-CM

## 2012-12-11 LAB — WOUND CULTURE: Gram Stain: NONE SEEN

## 2012-12-11 NOTE — Progress Notes (Signed)
Patient ID: Scott Avery MRN: [id number], DOB: 1966-04-27 47 y.o. Date of Encounter: 12/11/2012, 6:34 PM  Chief Complaint: Wound care   See previous note  HPI: 47 y.o. y/o male presents for wound care s/p I&D on 12/07/12. Doing well No issues or complaints Afebrile/ no chills No nausea or vomiting Tolerating doxycycline.  Pain completely resolved.  Daily dressing change Previous note reviewed  Past Medical History  Diagnosis Date  . Hypertension   . Sleep apnea      Home Meds: Prior to Admission medications   Medication Sig Start Date End Date Taking? Authorizing Provider  amLODipine (NORVASC) 5 MG tablet Take 1 tablet (5 mg total) by mouth daily. 07/31/12  Yes Pecola Lawless, MD  ciprofloxacin (CIPRO) 250 MG tablet Take 1 tablet (250 mg total) by mouth 2 (two) times daily. 12/09/12  Yes Ryan M Dunn, PA-C  doxycycline (VIBRAMYCIN) 100 MG capsule Take 1 capsule (100 mg total) by mouth 2 (two) times daily. 12/07/12  Yes Sherren Mocha, MD  HYDROcodone-acetaminophen (NORCO/VICODIN) 5-325 MG per tablet Take 1 tablet by mouth every 6 (six) hours as needed for pain. 12/07/12  Yes Sherren Mocha, MD  labetalol (NORMODYNE) 300 MG tablet Take 1 tablet (300 mg total) by mouth 2 (two) times daily. 07/31/12  Yes Pecola Lawless, MD  omeprazole (PRILOSEC) 20 MG capsule Take 20 mg by mouth daily.   Yes Historical Provider, MD  VIAGRA 100 MG tablet TAKE 1/2 TO 1 BY MOUTH AS NEEDED 10/31/10  Yes Pecola Lawless, MD    Allergies: No Known Allergies  ROS: Constitutional: Afebrile, no chills Cardiovascular: negative for chest pain or palpitations Dermatological: Positive for wound. Negative for erythema, pain, or warmth.  GI: No nausea or vomiting   EXAM: Physical Exam: Blood pressure 138/84, pulse 79, temperature 98.9 F (37.2 C), temperature source Oral, resp. rate 16, height 5\' 11"  (1.803 m), weight 337 lb (152.862 kg), SpO2 100.00%., Body mass index is 47.02 kg/(m^2). General: Well developed,  well nourished, in no acute distress. Nontoxic appearing. Head: Normocephalic, atraumatic, sclera non-icteric.  Neck: Supple. Lungs: Breathing is unlabored. Heart: Normal rate. Skin:  Warm and moist. Dressing and packing in place. No induration, erythema, or tenderness to palpation. Neuro: Alert and oriented X 3. Moves all extremities spontaneously. Normal gait.  Psych:  Responds to questions appropriately with a normal affect.       PROCEDURE: Dressing and packing removed. No purulence expressed Wound bed healthy Irrigated with 1% plain lidocaine 5 cc. Repacked with small amount of 1/4 plain packing.  Dressing applied  LAB: Culture: Group B strep  A/P: 47 y.o. y/o male with groin cellulitis/abscess as above s/p I&D on 12/07/12.  Wound care per above Continue doxycycline.  Pain well controlled Daily dressing changes Recheck 48 hours if needed. Instructed no further f/u needed if packing falls out as long as no increasing pain or redness.   Grier Mitts, PA-C 12/11/2012 6:34 PM

## 2013-02-18 ENCOUNTER — Other Ambulatory Visit: Payer: Self-pay | Admitting: Internal Medicine

## 2013-03-20 ENCOUNTER — Ambulatory Visit (INDEPENDENT_AMBULATORY_CARE_PROVIDER_SITE_OTHER): Payer: Medicare HMO | Admitting: Family Medicine

## 2013-03-20 ENCOUNTER — Encounter: Payer: Self-pay | Admitting: Family Medicine

## 2013-03-20 VITALS — BP 130/86 | HR 74 | Temp 98.5°F | Ht 71.0 in | Wt 349.6 lb

## 2013-03-20 DIAGNOSIS — M25559 Pain in unspecified hip: Secondary | ICD-10-CM

## 2013-03-20 DIAGNOSIS — M5430 Sciatica, unspecified side: Secondary | ICD-10-CM

## 2013-03-20 DIAGNOSIS — M5431 Sciatica, right side: Secondary | ICD-10-CM

## 2013-03-20 DIAGNOSIS — M25551 Pain in right hip: Secondary | ICD-10-CM

## 2013-03-20 NOTE — Assessment & Plan Note (Signed)
Consistent w/ sciatica- see above

## 2013-03-20 NOTE — Patient Instructions (Addendum)
This appears to be sciatica- inflammation of the nerve likely caused by lifting Make sure you are lifting w/ your legs If it happens again, ibuprofen or aleve as needed Heating pad as needed Cushioned insoles will help Healthy food choices and regular exercise will help take the extra weight off your back Call with any questions or concerns Hang in there!

## 2013-03-20 NOTE — Assessment & Plan Note (Signed)
New.  Pt's sxs consistent based on hx.  No pain today.  Reviewed sxs, causes, and treatment if this were to recur.  Reviewed supportive care and red flags that should prompt return.  Pt expressed understanding and is in agreement w/ plan.

## 2013-03-20 NOTE — Progress Notes (Signed)
  Subjective:    Patient ID: Scott Avery, male    DOB: November 07, 1965, 47 y.o.   MRN: [id number]  HPI Hip pain- R sided, first started 2.5-3 weeks ago.  Does a lot of heavy lifting and walking at work.  No known injury.  Hx of similar 3-4 yrs ago, can't remember course of tx.  Pain is somewhat improved today.  Pain worsens if constipated.  Pain described as a 'dull ache' that will 'sometimes travel down my leg'.  Pain is centralized over R buttock.  No difference when applying pressure.   Review of Systems For ROS see HPI     Objective:   Physical Exam  Vitals reviewed. Constitutional: He is oriented to person, place, and time. He appears well-developed and well-nourished. No distress.  obese  Musculoskeletal:  No TTP over R lumbar spine, paraspinal muscles, or buttock No pain w/ flexion/extension of spine (-) SLR x2  Neurological: He is alert and oriented to person, place, and time. He has normal reflexes. Coordination normal.          Assessment & Plan:

## 2013-05-10 ENCOUNTER — Ambulatory Visit (INDEPENDENT_AMBULATORY_CARE_PROVIDER_SITE_OTHER): Payer: Medicare HMO | Admitting: Internal Medicine

## 2013-05-10 ENCOUNTER — Encounter: Payer: Self-pay | Admitting: Internal Medicine

## 2013-05-10 VITALS — BP 160/88 | HR 75 | Temp 98.5°F | Wt 353.4 lb

## 2013-05-10 DIAGNOSIS — M5418 Radiculopathy, sacral and sacrococcygeal region: Secondary | ICD-10-CM

## 2013-05-10 DIAGNOSIS — IMO0002 Reserved for concepts with insufficient information to code with codable children: Secondary | ICD-10-CM

## 2013-05-10 MED ORDER — GABAPENTIN 100 MG PO CAPS: ORAL_CAPSULE | ORAL | Status: DC

## 2013-05-10 MED ORDER — HYDROCODONE-ACETAMINOPHEN 5-325 MG PO TABS: 1.0000 | ORAL_TABLET | Freq: Four times a day (QID) | ORAL | Status: DC | PRN

## 2013-05-10 NOTE — Progress Notes (Signed)
Subjective:    Patient ID: Scott Avery, male    DOB: [birth year], 47 y.o.   MRN: [id number]  HPI He has had intermittent pain in the right hip and down the leg for 2 months up to a level 10. He has had a job where he will lift 60-70 pounds 4-5 times per day and walks on concrete. He noted that while on vacation in October 2-8 he had even more pain even though he was not lifting or working.  He has had some weakness in his legs as well as some numbness and tingling or especially at the ankle area. The pain has impacted balance.  Since return to work October 11 he has had increasing pain. The pain will be in the right buttock and radiate down the back of the leg to the right lateral malleolar area. He does find that if he inverts the right foot and places pressure on the lateral aspect of the foot that the pain will decrease.  Hydrocodone helps some but he can only take it @ night. Sleeping in the left lateral decubitus position with a pillow between his knees has been of some benefit.  He feels that having gas/abdominal pressure prior bowel movements will aggravate the pain as well having to urinate. Passing gas tends to improve the pain.   Films of the hip in 2012 revealed degenerative changes greater on the right than the left    Review of Systems   He has not had associated fever, chills, sweats, or unexplained weight loss.  He denies any cranial nerve signs such as blurred vision, double vision, or loss of vision.  He also has not had abdominal pain, melena, or rectal bleeding.  He denies pyuria or hematuria.  There's been no rash or change in color or temperature of the skin in the area of the pain.  He also has not had any incontinence of urine or stool.  He has no abnormal bruising or bleeding    Objective:   Physical Exam Gen.: Uncomfortable ; adequately nourished but central obesity. Alert, appropriate and cooperative throughout exam. Head: Normocephalic without obvious  abnormalities Eyes: No corneal or conjunctival inflammation noted. Pupils equal round reactive to light and accommodation.  Extraocular motion intact.  Neck: No deformities, masses, or tenderness noted. Range of motion good. Lungs: Normal respiratory effort; chest expands symmetrically. Lungs are clear to auscultation without rales, wheezes, or increased work of breathing. Heart: Normal rate and rhythm. Normal S1 and S2. No gallop, click, or rub. S4 w/o murmur. Abdomen: Bowel sounds normal; abdomen soft and nontender. No masses or organomegaly .Large umbilical hernia noted.                                 Musculoskeletal/extremities: No deformity or scoliosis noted of  the thoracic or lumbar spine.   No clubbing, cyanosis, edema, or significant extremity  deformity noted. Range of motion normal .Tone & strength  Normal. Crepitus knees Joints  reveal minimal. Nail health good. Able to lie down & sit up w/o help. Negative SLR bilaterally Vascular: Carotid, radial artery, dorsalis pedis and  posterior tibial pulses are full and equal. No bruits present. Neurologic: Alert and oriented x3. Deep tendon reflexes symmetrical and normal.  Gait normal  including heel & toe walking .        Skin: Intact without suspicious lesions or rashes. Lymph: No cervical, axillary lymphadenopathy present. Psych: Mood  and affect are normal. Normally interactive                                                                                        Assessment & Plan:  #1 S1 radiculopathy probably due to degenerative changes at L5-S1  #2 degenerative joint disease  See orders

## 2013-05-10 NOTE — Patient Instructions (Signed)
Assess response to the gabapentin one every 8 hours as needed. If it is partially beneficial, it can be increased up to a total of 3 pills every 8 hours as needed. This increase of 1 pill each dose  should take place over 72 hours at least. Order for x-rays entered into  the computer; these will be performed at [address] [facility]. across from [facility]. No appointment is necessary.

## 2013-05-11 ENCOUNTER — Ambulatory Visit (HOSPITAL_COMMUNITY)
Admission: RE | Admit: 2013-05-11 | Discharge: 2013-05-11 | Disposition: A | Discharge status: Home / Self Care | Payer: Medicare HMO | Source: Ambulatory Visit | Attending: Internal Medicine | Admitting: Internal Medicine

## 2013-05-11 DIAGNOSIS — IMO0002 Reserved for concepts with insufficient information to code with codable children: Secondary | ICD-10-CM | POA: Insufficient documentation

## 2013-05-11 DIAGNOSIS — M5418 Radiculopathy, sacral and sacrococcygeal region: Secondary | ICD-10-CM

## 2013-05-11 DIAGNOSIS — M51379 Other intervertebral disc degeneration, lumbosacral region without mention of lumbar back pain or lower extremity pain: Secondary | ICD-10-CM | POA: Insufficient documentation

## 2013-05-11 DIAGNOSIS — M4310 Spondylolisthesis, site unspecified: Secondary | ICD-10-CM | POA: Insufficient documentation

## 2013-05-11 DIAGNOSIS — M5137 Other intervertebral disc degeneration, lumbosacral region: Secondary | ICD-10-CM | POA: Insufficient documentation

## 2013-05-11 DIAGNOSIS — Q762 Congenital spondylolisthesis: Secondary | ICD-10-CM | POA: Insufficient documentation

## 2013-05-14 ENCOUNTER — Encounter: Payer: Self-pay | Admitting: *Deleted

## 2013-05-14 NOTE — Progress Notes (Signed)
Letter mailed to patient.

## 2013-05-31 ENCOUNTER — Other Ambulatory Visit: Payer: Self-pay

## 2013-06-05 ENCOUNTER — Telehealth: Payer: Self-pay | Admitting: Internal Medicine

## 2013-06-05 ENCOUNTER — Other Ambulatory Visit: Payer: Self-pay | Admitting: *Deleted

## 2013-06-05 DIAGNOSIS — M5418 Radiculopathy, sacral and sacrococcygeal region: Secondary | ICD-10-CM

## 2013-06-05 MED ORDER — GABAPENTIN 100 MG PO CAPS: ORAL_CAPSULE | ORAL | Status: DC

## 2013-06-05 NOTE — Telephone Encounter (Signed)
Done

## 2013-06-05 NOTE — Telephone Encounter (Signed)
Patient is calling to request a refill on his gabapentin (NEURONTIN) 100 MG. He states that he is completely out and we have not responded to refill request faxed from pharmacy. Please advise.

## 2013-06-05 NOTE — Telephone Encounter (Signed)
Gabapentin refilled

## 2013-08-15 ENCOUNTER — Other Ambulatory Visit: Payer: Self-pay | Admitting: Internal Medicine

## 2013-08-15 NOTE — Telephone Encounter (Signed)
Amlodipine and Labetalol refilled per protocol. JG//CMA

## 2014-01-15 ENCOUNTER — Ambulatory Visit (INDEPENDENT_AMBULATORY_CARE_PROVIDER_SITE_OTHER): Payer: 59 | Admitting: Physician Assistant

## 2014-01-15 VITALS — BP 156/92 | HR 71 | Temp 100.0°F | Resp 20 | Ht 71.5 in | Wt 361.0 lb

## 2014-01-15 DIAGNOSIS — J069 Acute upper respiratory infection, unspecified: Secondary | ICD-10-CM

## 2014-01-15 DIAGNOSIS — B9789 Other viral agents as the cause of diseases classified elsewhere: Principal | ICD-10-CM

## 2014-01-15 MED ORDER — GUAIFENESIN ER 1200 MG PO TB12: 1.0000 | ORAL_TABLET | Freq: Two times a day (BID) | ORAL | Status: AC

## 2014-01-15 MED ORDER — DOXYCYCLINE HYCLATE 100 MG PO TABS: 100.0000 mg | ORAL_TABLET | Freq: Two times a day (BID) | ORAL | Status: DC

## 2014-01-15 MED ORDER — HYDROCOD POLST-CHLORPHEN POLST 10-8 MG/5ML PO LQCR: 5.0000 mL | Freq: Two times a day (BID) | ORAL | Status: AC

## 2014-01-15 MED ORDER — ALBUTEROL SULFATE HFA 108 (90 BASE) MCG/ACT IN AERS: 2.0000 | INHALATION_SPRAY | Freq: Four times a day (QID) | RESPIRATORY_TRACT | Status: DC | PRN

## 2014-01-15 NOTE — Progress Notes (Signed)
   Subjective:    Patient ID: Scott Avery, male    DOB: 09/01/1965, 48 y.o.   MRN: [id number]  HPI Pt presents to clinic with 4 day h/o cold symptoms - everyone at home has similar symptoms.  He has congestion with light yellow rhinorrhea and sputum.  He has had some chest tightness and wheezing esp at work where it is really hot but not that dusty.  He is trying to quit smoking and he has had nothing to smoke in the last 3 days.  He uses his CPAP without trouble over the last couple of days and the cough is not disrupting his sleep that much.  He has h/o childhood asthma but has had no problems in adulthood.  OTC - Cold preps with decongestant - not that much help  Review of Systems  Constitutional: Positive for fever (subjective). Negative for chills.  HENT: Positive for congestion, postnasal drip and rhinorrhea (light yellow).   Respiratory: Positive for cough (light yellow), chest tightness (with deep breaths) and wheezing (with cough).        Childhood asthma - no problems as an adult Trying to quit smoking  Gastrointestinal: Negative.   Musculoskeletal: Positive for myalgias.  Neurological: Negative for headaches.       Objective:   Physical Exam  Vitals reviewed. Constitutional: He is oriented to person, place, and time. He appears well-developed and well-nourished.  HENT:  Head: Normocephalic and atraumatic.  Right Ear: Hearing, tympanic membrane, external ear and ear canal normal.  Left Ear: Hearing, tympanic membrane, external ear and ear canal normal.  Nose: Mucosal edema (red) present.  Mouth/Throat: Uvula is midline, oropharynx is clear and moist and mucous membranes are normal.  Cardiovascular: Normal rate, regular rhythm and normal heart sounds.   Pulmonary/Chest: Effort normal and breath sounds normal. He has no wheezes.  Lymphadenopathy:    He has no cervical adenopathy.  Neurological: He is alert and oriented to person, place, and time.  Skin: Skin is warm and  dry.  Psychiatric: He has a normal mood and affect. His behavior is normal. Judgment and thought content normal.       Assessment & Plan:  Viral URI with cough - Plan: chlorpheniramine-HYDROcodone (TUSSIONEX PENNKINETIC ER) 10-8 MG/5ML LQCR, doxycycline (VIBRA-TABS) 100 MG tablet, Guaifenesin (MUCINEX MAXIMUM STRENGTH) 1200 MG TB12, albuterol (PROVENTIL HFA;VENTOLIN HFA) 108 (90 BASE) MCG/ACT inhaler  Symptomatic treatment in addition to covering for bacterial infection due to fever and smoking history.  Due to his chest tightness he will use albuterol prn.  He will increase fluid intake esp at work with the heat.  Windell Hummingbird PA-C  [facility] 01/15/2014 6:13 PM

## 2014-02-16 ENCOUNTER — Other Ambulatory Visit: Payer: Self-pay | Admitting: Internal Medicine

## 2014-03-13 ENCOUNTER — Ambulatory Visit (INDEPENDENT_AMBULATORY_CARE_PROVIDER_SITE_OTHER): Payer: 59 | Admitting: Internal Medicine

## 2014-03-13 ENCOUNTER — Encounter: Payer: Self-pay | Admitting: Internal Medicine

## 2014-03-13 VITALS — BP 148/80 | HR 77 | Temp 98.6°F | Wt 371.2 lb

## 2014-03-13 DIAGNOSIS — R7309 Other abnormal glucose: Secondary | ICD-10-CM

## 2014-03-13 DIAGNOSIS — Z6841 Body Mass Index (BMI) 40.0 and over, adult: Secondary | ICD-10-CM

## 2014-03-13 DIAGNOSIS — I10 Essential (primary) hypertension: Secondary | ICD-10-CM

## 2014-03-13 DIAGNOSIS — E8881 Metabolic syndrome: Secondary | ICD-10-CM

## 2014-03-13 NOTE — Patient Instructions (Signed)
Minimal Blood Pressure Goal= AVERAGE < 140/90;  Ideal is an AVERAGE < 135/85. This AVERAGE should be calculated from @ least 5-7 BP readings taken @ different times of day on different days of week. You should not respond to isolated BP readings , but rather the AVERAGE for that week .Please bring your  blood pressure cuff to office visits to verify that it is reliable.It  can also be checked against the blood pressure device at the pharmacy. Finger or wrist cuffs are not dependable; an arm cuff is.  Your next office appointment will be determined based upon review of your pending labs . Those instructions will be transmitted to you through My Chart

## 2014-03-13 NOTE — Progress Notes (Signed)
   Subjective:    Patient ID: Scott Avery, male    DOB: 1965-10-25, 48 y.o.   MRN: [id number]  HPI    He had nonfasting blood studies done at his place of employment 02/25/14. His A1c was 7.3%.  Paternal grandfather had diabetes  He he has stopped drinking soft drinks and decreased the amount of bread in his diet as of 3 weeks ago.  5 weeks ago he discontinued smoking.  He states that he walks a mile a day 2-3 times per week without cardiovascular symptoms  He does have some nocturia.  He has burning in the great toe.  He has had pain in the left heel.  Occasionally he has cramps in the legs for which he will take bananas.    Review of Systems  He denies polydipsia, polyphagia, polyuria.  He has no visual changes  He denies lightheadedness or near syncope.  He has no nonhealing skin lesions.  Chest pain, palpitations, tachycardia, exertional dyspnea, paroxysmal nocturnal dyspnea, claudication or edema are absent.       Objective:   Physical Exam  Positive or pertinent physical findings include:  As per CDC Guidelines ,Epic documents  Morbid obesity as being present . Ptosis of the right eye is present He has a mustache and long goatee. He exhibits an S4 without other significant cardiac findings Abdomen is protuberant with both ventral and umbilical hernias.  Eyes: No conjunctival inflammation or scleral icterus is present. Oral exam: Dental hygiene is good. Lips and gums are healthy appearing.There is no oropharyngeal erythema or exudate noted.  Heart:  Normal rate and regular rhythm. S1 and S2 normal without gallop, murmur, click, or rub. Lungs:Chest clear to auscultation; no wheezes, rhonchi,rales ,or rubs present.No increased work of breathing.  Abdomen: bowel sounds normal, soft and non-tender without masses, or organomegaly  noted. Skin:Warm & dry.  Intact without suspicious lesions or rashes ; no jaundice or tenting Lymphatic: No lymphadenopathy is  noted about the head, neck, axilla.             Assessment & Plan:  #1 A1c 7.3 % @ work #2 see other current diagnoses See orders & AVS

## 2014-03-13 NOTE — Progress Notes (Signed)
Pre visit review using our clinic review tool, if applicable. No additional management support is needed unless otherwise documented below in the visit note. 

## 2014-03-19 ENCOUNTER — Other Ambulatory Visit: Payer: Self-pay | Admitting: Internal Medicine

## 2014-03-19 ENCOUNTER — Other Ambulatory Visit (INDEPENDENT_AMBULATORY_CARE_PROVIDER_SITE_OTHER): Payer: 59

## 2014-03-19 DIAGNOSIS — E8881 Metabolic syndrome: Secondary | ICD-10-CM

## 2014-03-19 DIAGNOSIS — R7309 Other abnormal glucose: Secondary | ICD-10-CM

## 2014-03-19 DIAGNOSIS — I10 Essential (primary) hypertension: Secondary | ICD-10-CM

## 2014-03-19 LAB — HEPATIC FUNCTION PANEL
ALBUMIN: 3.7 g/dL (ref 3.5–5.2)
ALK PHOS: 55 U/L (ref 39–117)
ALT: 58 U/L — ABNORMAL HIGH (ref 0–53)
AST: 37 U/L (ref 0–37)
BILIRUBIN DIRECT: 0.1 mg/dL (ref 0.0–0.3)
Total Bilirubin: 0.4 mg/dL (ref 0.2–1.2)
Total Protein: 6.8 g/dL (ref 6.0–8.3)

## 2014-03-19 LAB — BASIC METABOLIC PANEL
BUN: 9 mg/dL (ref 6–23)
CHLORIDE: 106 meq/L (ref 96–112)
CO2: 29 mEq/L (ref 19–32)
CREATININE: 0.8 mg/dL (ref 0.4–1.5)
Calcium: 8.6 mg/dL (ref 8.4–10.5)
GFR: 108.89 mL/min (ref >60.00)
Glucose, Bld: 138 mg/dL — ABNORMAL HIGH (ref 70–99)
POTASSIUM: 3.6 meq/L (ref 3.5–5.1)
SODIUM: 139 meq/L (ref 135–145)

## 2014-03-19 LAB — MICROALBUMIN / CREATININE URINE RATIO
Creatinine,U: 139.6 mg/dL
MICROALB/CREAT RATIO: 0.4 mg/g (ref 0.0–30.0)
Microalb, Ur: 0.6 mg/dL (ref 0.0–1.9)

## 2014-03-19 LAB — HEMOGLOBIN A1C: HEMOGLOBIN A1C: 7.5 % — AB (ref 4.6–6.5)

## 2014-03-19 LAB — TSH: TSH: 3.21 u[IU]/mL (ref 0.35–4.50)

## 2014-03-20 ENCOUNTER — Telehealth: Payer: Self-pay | Admitting: Internal Medicine

## 2014-03-20 LAB — NMR LIPOPROFILE WITH LIPIDS
Cholesterol, Total: 130 mg/dL (ref <200)
HDL Particle Number: 22.5 umol/L — ABNORMAL LOW (ref >=30.5)
HDL SIZE: 8.3 nm — AB (ref >=9.2)
HDL-C: 29 mg/dL — ABNORMAL LOW (ref >=40)
LARGE VLDL-P: 3.8 nmol/L — AB (ref <=2.7)
LDL CALC: 66 mg/dL (ref <100)
LDL Particle Number: 1357 nmol/L — ABNORMAL HIGH (ref <1000)
LDL Size: 19.9 nm — ABNORMAL LOW (ref >20.5)
LP-IR SCORE: 70 — AB (ref <=45)
Large HDL-P: 1.4 umol/L — ABNORMAL LOW (ref >=4.8)
Small LDL Particle Number: 1017 nmol/L — ABNORMAL HIGH (ref <=527)
TRIGLYCERIDES: 176 mg/dL — AB (ref <150)
VLDL Size: 45.8 nm (ref <=46.6)

## 2014-03-20 NOTE — Telephone Encounter (Signed)
Relevant patient education assigned to patient using Emmi. ° °

## 2014-03-23 ENCOUNTER — Encounter: Payer: Self-pay | Admitting: Internal Medicine

## 2014-03-23 DIAGNOSIS — E781 Pure hyperglyceridemia: Secondary | ICD-10-CM | POA: Insufficient documentation

## 2014-03-23 DIAGNOSIS — E1165 Type 2 diabetes mellitus with hyperglycemia: Secondary | ICD-10-CM | POA: Insufficient documentation

## 2014-03-26 ENCOUNTER — Ambulatory Visit (INDEPENDENT_AMBULATORY_CARE_PROVIDER_SITE_OTHER): Payer: 59 | Admitting: Family Medicine

## 2014-03-26 ENCOUNTER — Other Ambulatory Visit (INDEPENDENT_AMBULATORY_CARE_PROVIDER_SITE_OTHER): Payer: 59

## 2014-03-26 ENCOUNTER — Encounter: Payer: Self-pay | Admitting: Family Medicine

## 2014-03-26 VITALS — BP 138/82 | HR 79 | Ht 72.0 in | Wt 368.0 lb

## 2014-03-26 DIAGNOSIS — M79609 Pain in unspecified limb: Secondary | ICD-10-CM

## 2014-03-26 DIAGNOSIS — M79672 Pain in left foot: Secondary | ICD-10-CM

## 2014-03-26 DIAGNOSIS — M722 Plantar fascial fibromatosis: Secondary | ICD-10-CM

## 2014-03-26 MED ORDER — MELOXICAM 15 MG PO TABS: 15.0000 mg | ORAL_TABLET | Freq: Every day | ORAL | Status: DC

## 2014-03-26 NOTE — Assessment & Plan Note (Signed)
This is a patient at length. We discussed proper shoe choices as well as insoles that could be beneficial. We also discussed icing protocol and home exercise program and showed proper technique. Patient was given a handout with pictures for other possible exercises. Discuss over-the-counter medications that could be beneficial and patient was given a prescription of anti-inflammatories. Patient and will come back again in 3 weeks for further evaluation and is continuing to have pain we'll consider injection. No stress fracture noted today and patient's symptoms do not correspond well to heal spur.

## 2014-03-26 NOTE — Progress Notes (Signed)
  [name] [place], [place] [address] Phone: [phone] Subjective:    I'm seeing this patient by the request  of:  Unice Cobble, MD   CC: Left heel and foot pain  Scott Avery is a 48 y.o. male coming in with complaint of left foot pain. Patient is pain is worse with first steps in the morning and after sitting for long amount of time. Patient describes as a searing pain. Seems to improve with he starts walking. Patient has tried some over-the-counter medications with no significant benefit. Denies any numbness or any radiation of pain. Seems to be localized to the medial aspect of his heel. Patient placed the severity had 8/10. Patient denies any injury to this no nighttime awakening.     Past medical history, social, surgical and family history all reviewed in electronic medical record.   Review of Systems: No headache, visual changes, nausea, vomiting, diarrhea, constipation, dizziness, abdominal pain, skin rash, fevers, chills, night sweats, weight loss, swollen lymph nodes, body aches, joint swelling, muscle aches, chest pain, shortness of breath, mood changes.   Objective Blood pressure 138/82, pulse 79, height 6' (1.829 m), weight 368 lb (166.924 kg), SpO2 97.00%.  General: No apparent distress alert and oriented x3 mood and affect normal, dressed appropriately. Severely obese HEENT: Pupils equal, extraocular movements intact  Respiratory: Patient's speak in full sentences and does not appear short of breath  Cardiovascular: No lower extremity edema, non tender, no erythema  Skin: Warm dry intact with no signs of infection or rash on extremities or on axial skeleton.  Abdomen: Soft nontender  Neuro: Cranial nerves II through XII are intact, neurovascularly intact in all extremities with 2+ DTRs and 2+ pulses.  Lymph: No lymphadenopathy of posterior or anterior cervical chain or axillae bilaterally.  Gait normal  with good balance and coordination.  MSK:  Non tender with full range of motion and good stability and symmetric strength and tone of shoulders, elbows, wrist, hip, knee and ankles bilaterally.  Normal inspection with no visable or palpable fat pad atrophy and no visible swelling/erythema. Patient is tender at medial insertion of plantar fascia into calcaneus. Great toe motion: Full range of motion Arch shape: Mild breakdown of the longitudinal and transverse arch bilaterally.   MSK US performed of: Left This study was ordered, performed, and interpreted by Charlann Boxer D.O.  Foot/Ankle:   All structures visualized.   Talar dome unremarkable  Ankle mortise without effusion. Peroneus longus and brevis tendons unremarkable on long and transverse views without sheath effusions. Posterior tibialis, flexor hallucis longus, and flexor digitorum longus tendons unremarkable on long and transverse views without sheath effusions. Achilles tendon visualized along length of tendon and unremarkable on long and transverse views without sheath effusion. Anterior Talofibular Ligament and Calcaneofibular Ligaments unremarkable and intact. Deltoid Ligament unremarkable and intact. Plantar fascia has significant hypoechoic changes surrounding and and is enlarging 1.4 cm in diameter. IMPRESSION: Plantar fasciitis     Impression and Recommendations:     This case required medical decision making of moderate complexity.

## 2014-03-26 NOTE — Patient Instructions (Signed)
Very nice to meet you You need better shoes.  Bronwen Betters, [employer] or new balance greater then 700.  Rigid sole shoe.  Consider spenco orthotics online "total support" Exercises 3 times a week in handout.  Ice bath 20 minutes at end of the day.  On step drop heels then up on toes hold for 2 seconds then down slow for a count of 4 second, 30 reps daily first week then 2 sets daily the 2nd week and then 3 sets daily thereafter.  Vitamin D 2000 IU daily.  Meloixcam daily.  Come back in 3 weeks.

## 2014-04-16 ENCOUNTER — Other Ambulatory Visit (INDEPENDENT_AMBULATORY_CARE_PROVIDER_SITE_OTHER): Payer: 59

## 2014-04-16 ENCOUNTER — Ambulatory Visit (INDEPENDENT_AMBULATORY_CARE_PROVIDER_SITE_OTHER): Payer: 59 | Admitting: Internal Medicine

## 2014-04-16 ENCOUNTER — Ambulatory Visit (INDEPENDENT_AMBULATORY_CARE_PROVIDER_SITE_OTHER): Payer: 59 | Admitting: Family Medicine

## 2014-04-16 ENCOUNTER — Encounter: Payer: Self-pay | Admitting: Family Medicine

## 2014-04-16 ENCOUNTER — Encounter: Payer: Self-pay | Admitting: Internal Medicine

## 2014-04-16 VITALS — BP 142/84 | HR 77 | Wt 367.0 lb

## 2014-04-16 DIAGNOSIS — M722 Plantar fascial fibromatosis: Secondary | ICD-10-CM

## 2014-04-16 DIAGNOSIS — R350 Frequency of micturition: Secondary | ICD-10-CM

## 2014-04-16 DIAGNOSIS — M79609 Pain in unspecified limb: Secondary | ICD-10-CM

## 2014-04-16 DIAGNOSIS — IMO0001 Reserved for inherently not codable concepts without codable children: Secondary | ICD-10-CM

## 2014-04-16 DIAGNOSIS — M79672 Pain in left foot: Secondary | ICD-10-CM

## 2014-04-16 DIAGNOSIS — E1165 Type 2 diabetes mellitus with hyperglycemia: Principal | ICD-10-CM

## 2014-04-16 LAB — URINALYSIS
Bilirubin Urine: NEGATIVE
Ketones, ur: NEGATIVE
LEUKOCYTES UA: NEGATIVE
Nitrite: NEGATIVE
Total Protein, Urine: NEGATIVE
URINE GLUCOSE: NEGATIVE
UROBILINOGEN UA: 0.2 (ref 0.0–1.0)
pH: 5.5 (ref 5.0–8.0)

## 2014-04-16 LAB — MICROALBUMIN / CREATININE URINE RATIO
CREATININE, U: 273.5 mg/dL
MICROALB UR: 0.9 mg/dL (ref 0.0–1.9)
MICROALB/CREAT RATIO: 0.3 mg/g (ref 0.0–30.0)

## 2014-04-16 MED ORDER — METFORMIN HCL 500 MG PO TABS: 500.0000 mg | ORAL_TABLET | Freq: Two times a day (BID) | ORAL | Status: DC

## 2014-04-16 NOTE — Progress Notes (Signed)
Patient was just seen by Dr Tamala Julian today. So did not repeat vitals.

## 2014-04-16 NOTE — Assessment & Plan Note (Signed)
Metformin 500 bid Urine microalbumin & UA today

## 2014-04-16 NOTE — Patient Instructions (Addendum)
Good to see you The injection should help Ice bath still would be good Continue exercises regularly Consider the shoe market for shoes.  Can stop the meloxicam and only use as needed.  Come back in 3 weeks to make sure you are doing better.

## 2014-04-16 NOTE — Assessment & Plan Note (Signed)
Patient was given an injection today. Patient tolerated the procedure well with complete resolution of pain. We discussed that this may only be temporary. This will allow patient to do the exercises more appropriately as well as start to attempt to lose weight. We discussed continuing the over-the-counter orthotics as well as the home exercises. Patient will continue the icing as well. The patient continues to have pain he'll come back again in 3 weeks. At that time we may want to consider custom orthotics otherwise we may need to consider formal physical therapy and further imaging.  Spent greater than 25 minutes with patient face-to-face and had greater than 50% of counseling including as described above in assessment and plan.

## 2014-04-16 NOTE — Progress Notes (Signed)
[facility] [place], [place] [address] Phone: [phone] Subjective:    CC: Left heel and foot pain followup  Scott Avery is a 48 y.o. male coming in with complaint of left foot pain. Patient is seen previously for a plantar fasciitis. Patient has been doing conservative therapy, patient has been doing home exercises, icing protocol, as well as the oral anti-inflammatories. Patient states that he has made a 20% improvement. Patient is still having pain after long time. Because of a dull aching pain and then a sharp pain after sitting a long amount of time or for status the morning. No new symptoms.    Past medical history, social, surgical and family history all reviewed in electronic medical record.   Review of Systems: No headache, visual changes, nausea, vomiting, diarrhea, constipation, dizziness, abdominal pain, skin rash, fevers, chills, night sweats, weight loss, swollen lymph nodes, body aches, joint swelling, muscle aches, chest pain, shortness of breath, mood changes.   Objective Blood pressure 142/84, pulse 77, weight 367 lb (166.47 kg), SpO2 97.00%.  General: No apparent distress alert and oriented x3 mood and affect normal, dressed appropriately. Severely obese HEENT: Pupils equal, extraocular movements intact  Respiratory: Patient's speak in full sentences and does not appear short of breath  Cardiovascular: No lower extremity edema, non tender, no erythema  Skin: Warm dry intact with no signs of infection or rash on extremities or on axial skeleton.  Abdomen: Soft nontender  Neuro: Cranial nerves II through XII are intact, neurovascularly intact in all extremities with 2+ DTRs and 2+ pulses.  Lymph: No lymphadenopathy of posterior or anterior cervical chain or axillae bilaterally.  Gait normal with good balance and coordination.  MSK:  Non tender with full range of motion and good stability and symmetric  strength and tone of shoulders, elbows, wrist, hip, knee and ankles bilaterally.  Normal inspection with no visable or palpable fat pad atrophy and no visible swelling/erythema. Patient is tender at medial insertion of plantar fascia into calcaneus. Great toe motion: Full range of motion Arch shape: Mild breakdown of the longitudinal and transverse arch bilaterally.   MSK US performed of: Left This study was ordered, performed, and interpreted by Scott Avery D.O.  Foot/Ankle:   All structures visualized.   Talar dome unremarkable  Ankle mortise without effusion. Peroneus longus and brevis tendons unremarkable on long and transverse views without sheath effusions. Posterior tibialis, flexor hallucis longus, and flexor digitorum longus tendons unremarkable on long and transverse views without sheath effusions. Achilles tendon visualized along length of tendon and unremarkable on long and transverse views without sheath effusion. Anterior Talofibular Ligament and Calcaneofibular Ligaments unremarkable and intact. Deltoid Ligament unremarkable and intact. Plantar fascia has significant hypoechoic changes. Difficult to assess how large it is secondary to the amount of swelling.Marland Kitchen IMPRESSION: Plantar fasciitis continued with worsening swelling.  Procedure: Real-time Ultrasound Guided Injection of left plantar fasciitis Device: GE Logiq E  Ultrasound guided injection is preferred based studies that show increased duration, increased effect, greater accuracy, decreased procedural pain, increased response rate, and decreased cost with ultrasound guided versus blind injection.  Verbal informed consent obtained.  Time-out conducted.  Noted no overlying erythema, induration, or other signs of local infection.  Skin prepped in a sterile fashion.  Local anesthesia: Topical Ethyl chloride.  With sterile technique and under real time ultrasound guidance:  With a 22-gauge 2 inch needle was injected with  0.5 cc of 0.5% Marcaine and  0.5 cc of Kenalog 40 mg/dL at the origin of the plantar fascia at the insertion of the calcaneal area. Completed without difficulty  Pain immediately resolved suggesting accurate placement of the medication.  Advised to call if fevers/chills, erythema, induration, drainage, or persistent bleeding.  Images permanently stored and available for review in the ultrasound unit.  Impression: Technically successful ultrasound guided injection.     Impression and Recommendations:     This case required medical decision making of moderate complexity.

## 2014-04-16 NOTE — Progress Notes (Signed)
Pre visit review using our clinic review tool, if applicable. No additional management support is needed unless otherwise documented below in the visit note. 

## 2014-04-16 NOTE — Patient Instructions (Signed)
Follow a low carb nutrition program such as  The New Sugar Busters as closely as possible to prevent Diabetes progression & complications.  White carbohydrates (potatoes, rice, bread, and pasta) cause a high spike of the sugar level which stays elevated for a significant period of time (called sugar"load").  For example a  baked potato has a cup of sugar and a  french fry  2 teaspoons of sugar.  More complex carbs such as yams, wild  rice, whole grained bread &  wheat pasta have been much lower spike and persistent load of sugar than the white carbs. The pancreas excretes excess insulin in response to the high spike & load of sugar . Over time the pancreas can actually run out of insulin necessitating insulin shots. Avoid High Fructose Corn Syrup in foods & drinks.

## 2014-04-16 NOTE — Progress Notes (Signed)
   Subjective:    Patient ID: Scott Avery, male    DOB: March 14, 1966, 48 y.o.   MRN: [id number]  HPI   He is here to F/U on the hemoglobin A1c which was 7.5% on 03/19/14. This correlates with an average sugar 190 and 50% increased long-term neuro/ coronary vascular wrist  He is asymptomatic except for increased frequency of urination during the day. He attributes this to increased amounts of fluids to keep from becoming dehydrated from sweating at work.    Review of Systems   He received a steroid   injection in the left foot today from Dr. Tamala Julian. He wanted to pursue this therapy rather than nitroglycerin patch topically.  Polyphagia, polydipsia absent. There is no blurred vision, double vision, or loss of vision.  Also denied are numbness, tingling, or burning of the extremities. No nonhealing skin lesions present. Weight is essentially stable.       Objective:   Physical Exam    Pertinent positive findings include: Massive abdomen with large hiatal hernia.  As per CDC Guidelines ,Epic documents morbid obesity as being present .  General appearance :adequately nourished; in no distress.  Eyes: No conjunctival inflammation or scleral icterus is present.  Oral exam: Lips and gums are healthy appearing.There is no oropharyngeal erythema or exudate noted.   Heart:  Normal rate and regular rhythm. S1 and S2 normal without gallop, murmur, click, rub or other extra sounds     Lungs:Chest clear to auscultation; no wheezes, rhonchi,rales ,or rubs present.No increased work of breathing.   Abdomen: bowel sounds normal, soft and non-tender without masses, organomegaly or hernias noted.  No guarding or rebound. No flank tenderness to percussion.  Skin:Warm & dry.  Intact without suspicious lesions or rashes ; no jaundice or tenting  Lymphatic: No lymphadenopathy is noted about the head, neck, axilla            Assessment & Plan:  See Current Assessment & Plan in Problem List  under specific Diagnosis

## 2014-04-19 ENCOUNTER — Encounter: Payer: Self-pay | Admitting: Internal Medicine

## 2014-04-19 DIAGNOSIS — R3129 Other microscopic hematuria: Secondary | ICD-10-CM | POA: Insufficient documentation

## 2014-05-07 ENCOUNTER — Ambulatory Visit: Payer: 59 | Admitting: Family Medicine

## 2014-05-17 ENCOUNTER — Encounter: Payer: Self-pay | Admitting: Family Medicine

## 2014-05-17 ENCOUNTER — Ambulatory Visit (INDEPENDENT_AMBULATORY_CARE_PROVIDER_SITE_OTHER): Payer: 59 | Admitting: Family Medicine

## 2014-05-17 ENCOUNTER — Ambulatory Visit (HOSPITAL_COMMUNITY)
Admission: RE | Admit: 2014-05-17 | Discharge: 2014-05-17 | Disposition: A | Discharge status: Home / Self Care | Payer: 59 | Source: Ambulatory Visit | Attending: Family Medicine | Admitting: Family Medicine

## 2014-05-17 VITALS — BP 152/80 | HR 76 | Ht 72.0 in | Wt 367.0 lb

## 2014-05-17 DIAGNOSIS — M25551 Pain in right hip: Secondary | ICD-10-CM

## 2014-05-17 DIAGNOSIS — M5416 Radiculopathy, lumbar region: Secondary | ICD-10-CM | POA: Insufficient documentation

## 2014-05-17 DIAGNOSIS — M722 Plantar fascial fibromatosis: Secondary | ICD-10-CM

## 2014-05-17 MED ORDER — TRAMADOL HCL 50 MG PO TABS: 50.0000 mg | ORAL_TABLET | Freq: Every evening | ORAL | Status: DC | PRN

## 2014-05-17 NOTE — Assessment & Plan Note (Signed)
Resolve at this time 

## 2014-05-17 NOTE — Assessment & Plan Note (Addendum)
Patient is having lumbar radiculopathy that can be consistent with a L5 as well as at S1 nerve root impingement. Patient does have a grade 2 spinal listhesis on x-ray. Patient was given some home exercises and icing protocol. Differential also includes hip arthritis secondary to the amount of pain on physical exam today. We did give a x-ray. Results show patient is a moderate osteophytic changes. We can discuss with patient about either sending patient for an MRI of the lumbar spine or any intra-articular hip injection. Depending on the findings this will change patient's treatment options. Patient and will come back and see me after any further workup. Spent greater than 25 minutes with patient face-to-face and had greater than 50% of counseling including as described above in assessment and plan.

## 2014-05-17 NOTE — Patient Instructions (Signed)
Good to see you.  Ice 20 minutes at the end of the day.  We will get xray of hip Take tylenol 650 mg three times a day is the best evidence based medicine we have for arthritis.  Glucosamine sulfate 750mg  twice a day is a supplement that has been shown to help moderate to severe arthritis. Vitamin D 2000 IU daily Fish oil 2 grams daily.  Tumeric 500mg  twice daily.  Capsaicin topically up to four times a day may also help with pain  Call me in 1 week and if still in pain we will get MRI of back and right hip then come back 1-2 days after MRI and we will discuss and consider injection in the hip.

## 2014-05-17 NOTE — Progress Notes (Signed)
[name] [place], [place] [address] Phone: [phone] Subjective:    CC: Left heel and foot pain followup  OHY:[id number] JAMUS LOVING is a 48 y.o. male coming in with complaint of left foot pain. Patient is seen previously for a plantar fasciitis. Patient has been doing conservative therapy, patient has been doing home exercises, icing protocol, as well as the oral anti-inflammatories. Patient at last visit did have a corticosteroid injection under ultrasound guidance. Patient states that he is 100% healed at this time. Not having any pain. Continues exercises and icing no. Denies any new symptoms of this foot.  Patient who is coming in with low back pain. Patient was have an exacerbation of his back pain and x-rays were ordered. Patient x-rays were reviewed by me today. Patient's lumbar spine show that patient does have a grade 2 spondylolisthesis of L5 on S1 to do pars defect on L5.   She states she continues to have pain that seems to radiate down his leg. Patient states it seems to be more of his whole leg feeling that it is very tight. Patient noticed the pain if he lifts his leg and reaches across his body. Denies any weakness but states that he can feel heavy. States that the pain can hurt him at night as well. States that the severity when the electric shock occurs as 9/10. Sometimes though he can be without pain.   Past medical history, social, surgical and family history all reviewed in electronic medical record.   Review of Systems: No headache, visual changes, nausea, vomiting, diarrhea, constipation, dizziness, abdominal pain, skin rash, fevers, chills, night sweats, weight loss, swollen lymph nodes, body aches, joint swelling, muscle aches, chest pain, shortness of breath, mood changes.   Objective Blood pressure 152/80, pulse 76, weight 367 lb (166.47 kg).  General: No apparent distress alert and oriented x3 mood and affect  normal, dressed appropriately. Severely obese HEENT: Pupils equal, extraocular movements intact  Respiratory: Patient's speak in full sentences and does not appear short of breath  Cardiovascular: No lower extremity edema, non tender, no erythema  Skin: Warm dry intact with no signs of infection or rash on extremities or on axial skeleton.  Abdomen: Soft nontender  Neuro: Cranial nerves II through XII are intact, neurovascularly intact in all extremities with 2+ DTRs and 2+ pulses.  Lymph: No lymphadenopathy of posterior or anterior cervical chain or axillae bilaterally.  Gait normal with good balance and coordination.  MSK:  Non tender with full range of motion and good stability and symmetric strength and tone of shoulders, elbows, wrist, hip, knee and ankles bilaterally.  Normal inspection with no visable or palpable fat pad atrophy and no visible swelling/erythema. Patient a longer tender to palpation over the medial calcaneal region. Great toe motion: Full range of motion Arch shape: Mild breakdown of the longitudinal and transverse arch bilaterally.  Back Exam:  Inspection: Unremarkable  Motion: Flexion 35 deg, Extension 35 deg, Side Bending to 35 deg bilaterally,  Rotation to 45 deg bilaterally  SLR laying: Right side XSLR laying: Negative  Palpable tenderness: Mild tenderness over the paraspinal musculature especially L5-S1 on the right side. FABER: negative. +++ FADIR Sensory change: Gross sensation intact to all lumbar and sacral dermatomes.  Reflexes: 2+ at both patellar tendons, 2+ at achilles tendons, Babinski's downgoing.  Strength at foot  Plantar-flexion: 5/5 Dorsi-flexion: 5/5 Eversion: 5/5 Inversion: 5/5  Leg strength  Quad: 5/5 Hamstring: 4/5 Hip flexor:  4/5 Hip abductors: 3+/5  Gait unremarkable.   Impression and Recommendations:     This case required medical decision making of moderate complexity.

## 2014-05-26 DIAGNOSIS — K56609 Unspecified intestinal obstruction, unspecified as to partial versus complete obstruction: Secondary | ICD-10-CM

## 2014-05-26 HISTORY — DX: Unspecified intestinal obstruction, unspecified as to partial versus complete obstruction: K56.609

## 2014-06-21 ENCOUNTER — Emergency Department (HOSPITAL_COMMUNITY): Payer: 59

## 2014-06-21 ENCOUNTER — Encounter (HOSPITAL_COMMUNITY): Payer: Self-pay | Admitting: Emergency Medicine

## 2014-06-21 ENCOUNTER — Inpatient Hospital Stay (HOSPITAL_COMMUNITY)
Admission: EM | Admit: 2014-06-21 | Discharge: 2014-06-22 | DRG: 389 | Disposition: A | Discharge status: Home / Self Care | Payer: 59 | Attending: Internal Medicine | Admitting: Internal Medicine

## 2014-06-21 DIAGNOSIS — G4733 Obstructive sleep apnea (adult) (pediatric): Secondary | ICD-10-CM | POA: Diagnosis present

## 2014-06-21 DIAGNOSIS — R109 Unspecified abdominal pain: Secondary | ICD-10-CM

## 2014-06-21 DIAGNOSIS — Z87891 Personal history of nicotine dependence: Secondary | ICD-10-CM | POA: Diagnosis not present

## 2014-06-21 DIAGNOSIS — M1990 Unspecified osteoarthritis, unspecified site: Secondary | ICD-10-CM | POA: Diagnosis present

## 2014-06-21 DIAGNOSIS — Z6841 Body Mass Index (BMI) 40.0 and over, adult: Secondary | ICD-10-CM

## 2014-06-21 DIAGNOSIS — I10 Essential (primary) hypertension: Secondary | ICD-10-CM | POA: Diagnosis present

## 2014-06-21 DIAGNOSIS — E860 Dehydration: Secondary | ICD-10-CM | POA: Diagnosis present

## 2014-06-21 DIAGNOSIS — K56609 Unspecified intestinal obstruction, unspecified as to partial versus complete obstruction: Secondary | ICD-10-CM | POA: Diagnosis present

## 2014-06-21 DIAGNOSIS — K529 Noninfective gastroenteritis and colitis, unspecified: Secondary | ICD-10-CM | POA: Diagnosis present

## 2014-06-21 DIAGNOSIS — E781 Pure hyperglyceridemia: Secondary | ICD-10-CM | POA: Diagnosis present

## 2014-06-21 DIAGNOSIS — K5669 Other intestinal obstruction: Secondary | ICD-10-CM | POA: Diagnosis not present

## 2014-06-21 DIAGNOSIS — E119 Type 2 diabetes mellitus without complications: Secondary | ICD-10-CM | POA: Diagnosis present

## 2014-06-21 DIAGNOSIS — Z79899 Other long term (current) drug therapy: Secondary | ICD-10-CM | POA: Diagnosis not present

## 2014-06-21 DIAGNOSIS — K219 Gastro-esophageal reflux disease without esophagitis: Secondary | ICD-10-CM | POA: Diagnosis present

## 2014-06-21 DIAGNOSIS — G4730 Sleep apnea, unspecified: Secondary | ICD-10-CM | POA: Diagnosis present

## 2014-06-21 DIAGNOSIS — K56600 Partial intestinal obstruction, unspecified as to cause: Secondary | ICD-10-CM | POA: Diagnosis present

## 2014-06-21 HISTORY — DX: Obstructive sleep apnea (adult) (pediatric): G47.33

## 2014-06-21 HISTORY — DX: Unspecified osteoarthritis, unspecified site: M19.90

## 2014-06-21 HISTORY — DX: Dependence on other enabling machines and devices: Z99.89

## 2014-06-21 HISTORY — DX: Type 2 diabetes mellitus without complications: E11.9

## 2014-06-21 HISTORY — DX: Unspecified asthma, uncomplicated: J45.909

## 2014-06-21 LAB — CBC WITH DIFFERENTIAL/PLATELET
BASOS PCT: 0 % (ref 0–1)
Basophils Absolute: 0 10*3/uL (ref 0.0–0.1)
EOS ABS: 0.1 10*3/uL (ref 0.0–0.7)
EOS PCT: 1 % (ref 0–5)
HEMATOCRIT: 53.3 % — AB (ref 39.0–52.0)
Hemoglobin: 18.5 g/dL — ABNORMAL HIGH (ref 13.0–17.0)
Lymphocytes Relative: 12 % (ref 12–46)
Lymphs Abs: 1.6 10*3/uL (ref 0.7–4.0)
MCH: 30.8 pg (ref 26.0–34.0)
MCHC: 34.7 g/dL (ref 30.0–36.0)
MCV: 88.7 fL (ref 78.0–100.0)
MONOS PCT: 3 % (ref 3–12)
Monocytes Absolute: 0.4 10*3/uL (ref 0.1–1.0)
NEUTROS ABS: 11.4 10*3/uL — AB (ref 1.7–7.7)
Neutrophils Relative %: 84 % — ABNORMAL HIGH (ref 43–77)
Platelets: 268 10*3/uL (ref 150–400)
RBC: 6.01 MIL/uL — ABNORMAL HIGH (ref 4.22–5.81)
RDW: 12.9 % (ref 11.5–15.5)
WBC: 13.5 10*3/uL — ABNORMAL HIGH (ref 4.0–10.5)

## 2014-06-21 LAB — COMPREHENSIVE METABOLIC PANEL
ALBUMIN: 3.5 g/dL (ref 3.5–5.2)
ALT: 66 U/L — ABNORMAL HIGH (ref 0–53)
AST: 60 U/L — AB (ref 0–37)
Alkaline Phosphatase: 63 U/L (ref 39–117)
Anion gap: 18 — ABNORMAL HIGH (ref 5–15)
BUN: 15 mg/dL (ref 6–23)
CALCIUM: 8.9 mg/dL (ref 8.4–10.5)
CHLORIDE: 102 meq/L (ref 96–112)
CO2: 19 mEq/L (ref 19–32)
Creatinine, Ser: 0.96 mg/dL (ref 0.50–1.35)
GFR calc Af Amer: >90 mL/min (ref >90)
GFR calc non Af Amer: >90 mL/min (ref >90)
Glucose, Bld: 249 mg/dL — ABNORMAL HIGH (ref 70–99)
Potassium: 4 mEq/L (ref 3.7–5.3)
Sodium: 139 mEq/L (ref 137–147)
TOTAL PROTEIN: 7 g/dL (ref 6.0–8.3)
Total Bilirubin: 0.8 mg/dL (ref 0.3–1.2)

## 2014-06-21 LAB — URINALYSIS, ROUTINE W REFLEX MICROSCOPIC
GLUCOSE, UA: 100 mg/dL — AB
Hgb urine dipstick: NEGATIVE
KETONES UR: 15 mg/dL — AB
Nitrite: NEGATIVE
PROTEIN: 100 mg/dL — AB
Specific Gravity, Urine: 1.036 — ABNORMAL HIGH (ref 1.005–1.030)
Urobilinogen, UA: 0.2 mg/dL (ref 0.0–1.0)
pH: 5 (ref 5.0–8.0)

## 2014-06-21 LAB — I-STAT CG4 LACTIC ACID, ED: Lactic Acid, Venous: 1.83 mmol/L (ref 0.5–2.2)

## 2014-06-21 LAB — URINE MICROSCOPIC-ADD ON

## 2014-06-21 LAB — GLUCOSE, CAPILLARY
GLUCOSE-CAPILLARY: 114 mg/dL — AB (ref 70–99)
GLUCOSE-CAPILLARY: 137 mg/dL — AB (ref 70–99)

## 2014-06-21 LAB — LIPASE, BLOOD: Lipase: 51 U/L (ref 11–59)

## 2014-06-21 LAB — CLOSTRIDIUM DIFFICILE BY PCR
Toxigenic C. Difficile by PCR: NEGATIVE
Toxigenic C. Difficile by PCR: NEGATIVE

## 2014-06-21 MED ORDER — SODIUM CHLORIDE 0.9 % IV SOLN
1000.0000 mL | Freq: Once | INTRAVENOUS | Status: AC
  Administered 2014-06-21: 1000 mL via INTRAVENOUS

## 2014-06-21 MED ORDER — IOHEXOL 300 MG/ML  SOLN
100.0000 mL | Freq: Once | INTRAMUSCULAR | Status: AC | PRN
  Administered 2014-06-21: 100 mL via INTRAVENOUS

## 2014-06-21 MED ORDER — DIPHENHYDRAMINE HCL 50 MG/ML IJ SOLN
50.0000 mg | Freq: Once | INTRAMUSCULAR | Status: AC
  Administered 2014-06-21: 50 mg via INTRAVENOUS
  Filled 2014-06-21: qty 1

## 2014-06-21 MED ORDER — SODIUM CHLORIDE 0.9 % IV SOLN
1000.0000 mL | INTRAVENOUS | Status: DC
  Administered 2014-06-21: 1000 mL via INTRAVENOUS

## 2014-06-21 MED ORDER — INSULIN ASPART 100 UNIT/ML ~~LOC~~ SOLN
0.0000 [IU] | SUBCUTANEOUS | Status: DC
Start: 2014-06-21 — End: 2014-06-21
  Administered 2014-06-21: 1 [IU] via SUBCUTANEOUS

## 2014-06-21 MED ORDER — POTASSIUM CHLORIDE IN NACL 20-0.9 MEQ/L-% IV SOLN
INTRAVENOUS | Status: DC
  Administered 2014-06-21 – 2014-06-22 (×2): via INTRAVENOUS
  Filled 2014-06-21 (×3): qty 1000

## 2014-06-21 MED ORDER — PANTOPRAZOLE SODIUM 40 MG PO TBEC
40.0000 mg | DELAYED_RELEASE_TABLET | Freq: Every day | ORAL | Status: DC
  Administered 2014-06-21 – 2014-06-22 (×2): 40 mg via ORAL
  Filled 2014-06-21 (×2): qty 1

## 2014-06-21 MED ORDER — SODIUM CHLORIDE 0.9 % IV SOLN: INTRAVENOUS | Status: DC

## 2014-06-21 MED ORDER — CIPROFLOXACIN IN D5W 400 MG/200ML IV SOLN
400.0000 mg | Freq: Two times a day (BID) | INTRAVENOUS | Status: DC
  Administered 2014-06-21 – 2014-06-22 (×2): 400 mg via INTRAVENOUS
  Filled 2014-06-21 (×3): qty 200

## 2014-06-21 MED ORDER — IOHEXOL 300 MG/ML  SOLN
25.0000 mL | Freq: Once | INTRAMUSCULAR | Status: AC | PRN
  Administered 2014-06-21: 25 mL via ORAL

## 2014-06-21 MED ORDER — HYDROMORPHONE HCL 1 MG/ML IJ SOLN
1.0000 mg | INTRAMUSCULAR | Status: AC | PRN
  Filled 2014-06-21: qty 1

## 2014-06-21 MED ORDER — ONDANSETRON HCL 4 MG/2ML IJ SOLN: 4.0000 mg | Freq: Four times a day (QID) | INTRAMUSCULAR | Status: DC | PRN

## 2014-06-21 MED ORDER — HYDROMORPHONE HCL 1 MG/ML IJ SOLN
1.0000 mg | Freq: Once | INTRAMUSCULAR | Status: AC
  Administered 2014-06-21: 1 mg via INTRAVENOUS
  Filled 2014-06-21: qty 1

## 2014-06-21 MED ORDER — HEPARIN SODIUM (PORCINE) 5000 UNIT/ML IJ SOLN
5000.0000 [IU] | Freq: Three times a day (TID) | INTRAMUSCULAR | Status: DC
  Administered 2014-06-21 – 2014-06-22 (×3): 5000 [IU] via SUBCUTANEOUS
  Filled 2014-06-21 (×6): qty 1

## 2014-06-21 MED ORDER — HYDRALAZINE HCL 20 MG/ML IJ SOLN: 10.0000 mg | INTRAMUSCULAR | Status: DC | PRN

## 2014-06-21 MED ORDER — ONDANSETRON HCL 4 MG/2ML IJ SOLN
4.0000 mg | Freq: Once | INTRAMUSCULAR | Status: AC
  Administered 2014-06-21: 4 mg via INTRAVENOUS
  Filled 2014-06-21: qty 2

## 2014-06-21 MED ORDER — LABETALOL HCL 300 MG PO TABS
300.0000 mg | ORAL_TABLET | Freq: Two times a day (BID) | ORAL | Status: DC
  Administered 2014-06-21 – 2014-06-22 (×2): 300 mg via ORAL
  Filled 2014-06-21 (×3): qty 1

## 2014-06-21 MED ORDER — METRONIDAZOLE IN NACL 5-0.79 MG/ML-% IV SOLN
500.0000 mg | Freq: Three times a day (TID) | INTRAVENOUS | Status: DC
  Administered 2014-06-21 – 2014-06-22 (×3): 500 mg via INTRAVENOUS
  Filled 2014-06-21 (×4): qty 100

## 2014-06-21 MED ORDER — MORPHINE SULFATE 2 MG/ML IJ SOLN: 2.0000 mg | INTRAMUSCULAR | Status: DC | PRN

## 2014-06-21 MED ORDER — INSULIN ASPART 100 UNIT/ML ~~LOC~~ SOLN: 0.0000 [IU] | SUBCUTANEOUS | Status: DC

## 2014-06-21 MED ORDER — HYDROCODONE-ACETAMINOPHEN 5-325 MG PO TABS
1.0000 | ORAL_TABLET | ORAL | Status: DC | PRN
  Administered 2014-06-21: 1 via ORAL
  Filled 2014-06-21: qty 1

## 2014-06-21 MED ORDER — SODIUM CHLORIDE 0.9 % IV BOLUS (SEPSIS)
1000.0000 mL | Freq: Once | INTRAVENOUS | Status: AC
  Administered 2014-06-21: 1000 mL via INTRAVENOUS

## 2014-06-21 MED ORDER — ONDANSETRON HCL 4 MG PO TABS: 4.0000 mg | ORAL_TABLET | Freq: Four times a day (QID) | ORAL | Status: DC | PRN

## 2014-06-21 MED ORDER — ONDANSETRON HCL 4 MG/2ML IJ SOLN: 4.0000 mg | Freq: Three times a day (TID) | INTRAMUSCULAR | Status: AC | PRN

## 2014-06-21 MED ORDER — AMLODIPINE BESYLATE 5 MG PO TABS: 5.0000 mg | ORAL_TABLET | Freq: Every day | ORAL | Status: DC

## 2014-06-21 MED ORDER — GUAIFENESIN-DM 100-10 MG/5ML PO SYRP
5.0000 mL | ORAL_SOLUTION | ORAL | Status: DC | PRN
  Filled 2014-06-21: qty 5

## 2014-06-21 MED ORDER — LABETALOL HCL 300 MG PO TABS
300.0000 mg | ORAL_TABLET | Freq: Two times a day (BID) | ORAL | Status: DC
  Filled 2014-06-21: qty 1

## 2014-06-21 MED ORDER — AMLODIPINE BESYLATE 10 MG PO TABS
10.0000 mg | ORAL_TABLET | Freq: Every day | ORAL | Status: DC
  Administered 2014-06-22: 10 mg via ORAL
  Filled 2014-06-21 (×2): qty 1

## 2014-06-21 NOTE — ED Notes (Signed)
Pt SpO2 88-90% RA, placed on 2 L nasal cannula.

## 2014-06-21 NOTE — ED Notes (Signed)
Pt from home with c/o abdominal pain, emesis, and diarrhea since Monday.  Pt began itching all over and feeling dizzy this am.  Pt in NAD, A&O.

## 2014-06-21 NOTE — Progress Notes (Signed)
NURSING PROGRESS NOTE  [facility] TUFO [id number] Admission Data: 06/21/2014 7:59 PM Attending Provider: Thurnell Lose, MD [id number] Linna Darner, MD Code Status: Full  Jusiah Aguayo Lucier is a 48 y.o. male patient admitted from ED:  -No acute distress noted.  -No complaints of shortness of breath.  -No complaints of chest pain.   Blood pressure 158/100, pulse 83, temperature 98.5 F (36.9 C), temperature source Oral, resp. rate 18, height 6' (1.829 m), weight 166.47 kg (367 lb), SpO2 98 %.   IV Fluids:  IV in place, occlusive dsg intact without redness, IV cath antecubital right, condition patent and no redness normal saline.   Allergies:  Review of patient's allergies indicates no known allergies.  Past Medical History:   has a past medical history of Hypertension; Childhood asthma; OSA on CPAP; Type II diabetes mellitus (dx'd ~ 03/2014); and Arthritis.  Past Surgical History:   has past surgical history that includes Lipoma excision (Left, ~ 2010); Wisdom tooth extraction; Hernia repair; Laparoscopic incisional / umbilical / ventral hernia repair (~ 2007); and Laparoscopic cholecystectomy (2005).  Social History:   reports that he quit smoking about 5 months ago. His smoking use included Cigarettes. He has a 10 pack-year smoking history. He has never used smokeless tobacco. He reports that he drinks alcohol. He reports that he does not use illicit drugs.  Skin: Intact  Patient/Family orientated to room. Information packet given to patient/family. Admission inpatient armband information verified with patient/family to include name and date of birth and placed on patient arm. Side rails up x 2, fall assessment and education completed with patient/family. Patient/family able to verbalize understanding of risk associated with falls and verbalized understanding to call for assistance before getting out of bed. Call light within reach. Patient/family able to voice and demonstrate understanding  of unit orientation instructions.    Will continue to evaluate and treat per MD orders.

## 2014-06-21 NOTE — H&P (Addendum)
Patient Demographics  Scott Avery, is a 48 y.o. male  MRN: [id number]   DOB - [birth year]  Admit Date - 06/21/2014  Outpatient Primary MD for the patient is Unice Cobble, MD   With History of -  Past Medical History  Diagnosis Date  . Hypertension   . Sleep apnea   . Diabetes mellitus without complication       Past Surgical History  Procedure Laterality Date  . Cholecystectomy    . Umbilical surgery      after #1  . Lipoma excision       L forearm  . Wisdom tooth extraction      in for   Chief Complaint  Patient presents with  . Emesis  . Abdominal Pain  . Diarrhea  . Dizziness  . Pruritis     HPI  Scott Avery  is a 48 y.o. male, with history of hypertension, type 2 diabetes mellitus recently diagnosed, obstructive sleep apnea, history of a medical hernia surgery a few years ago comes into the hospital with 6-7 day history of nausea vomiting and diarrhea along with periumbilical pain which is dull, constant, worse after eating food better with bowel rest, symptoms continued so he came to the ER where a CT scan of the abdomen and pelvis showed changes suggestive of small bowel thickening suspicious for inflammation/infection along with possible small bowel obstruction. No fever or chills, no chest pain cough and shortness of breath, no exposure to antibiotics or food items which are bad, no other family members sick.  In the ER he has some periumbilical cramping, mild nausea, ongoing diarrhea, does not appear toxic.    Review of Systems    In addition to the HPI above,   No Fever-chills, No Headache, No changes with Vision or hearing, No problems swallowing food or Liquids, No Chest pain, Cough or Shortness of Breath, +ve periumblical Abdominal pain, +ve  Nausea & Vommitting, +ve  diarrhea No Blood in stool or Urine, No dysuria, No new skin rashes or bruises, No new joints pains-aches,  No new weakness, tingling, numbness in any extremity, No recent weight gain or loss, No polyuria, polydypsia or polyphagia, No significant Mental Stressors.  A full 10 point Review of Systems was done, except as stated above, all other Review of Systems were negative.   Social History History  Substance Use Topics  . Smoking status: Former Smoker    Types: Cigarettes  . Smokeless tobacco: Never Used     Comment: 1/3 ppd  . Alcohol Use: No      Family History Family History  Problem Relation Age of Onset  . Heart disease Father     MI @ 85 in context of PNA  . Hypertension Father   . Leukemia Maternal Grandfather   . Prostate cancer Paternal Grandfather   . Diabetes Paternal Grandfather   . Stroke Neg Hx       Prior to Admission medications  Medication Sig Start Date End Date Taking? Authorizing Provider  amLODipine (NORVASC) 5 MG tablet TAKE 1 TABLET BY MOUTH EVERY DAY   Yes Hendricks Limes, MD  ibuprofen (ADVIL,MOTRIN) 400 MG tablet Take 400 mg by mouth every 6 (six) hours as needed for fever or moderate pain.   Yes Historical Provider, MD  labetalol (NORMODYNE) 300 MG tablet TAKE 1 TABLET BY MOUTH TWICE A DAY   Yes Hendricks Limes, MD  meloxicam (MOBIC) 15 MG tablet Take 1 tablet (15 mg total) by mouth daily. 03/26/14  Yes Lyndal Pulley, DO  metFORMIN (GLUCOPHAGE) 500 MG tablet Take 1 tablet (500 mg total) by mouth 2 (two) times daily with a meal. 04/16/14  Yes Hendricks Limes, MD  omeprazole (PRILOSEC) 20 MG capsule Take 20 mg by mouth daily.   Yes Historical Provider, MD  traMADol (ULTRAM) 50 MG tablet Take 1 tablet (50 mg total) by mouth at bedtime as needed. Patient not taking: Reported on 06/21/2014 05/17/14   Lyndal Pulley, DO    No Known Allergies  Physical Exam  Vitals  Blood pressure 132/42, pulse 88, temperature 97.6 F (36.4 C),  temperature source Oral, resp. rate 16, height 6' (1.829 m), weight 166.47 kg (367 lb), SpO2 91 %.   1. General middle aged obese white male lying in bed in NAD,    2. Normal affect and insight, Not Suicidal or Homicidal, Awake Alert, Oriented X 3.  3. No F.N deficits, ALL C.Nerves Intact, Strength 5/5 all 4 extremities, Sensation intact all 4 extremities, Plantars down going.  4. Ears and Eyes appear Normal, Conjunctivae clear, PERRLA. Moist Oral Mucosa.  5. Supple Neck, No JVD, No cervical lymphadenopathy appriciated, No Carotid Bruits.  6. Symmetrical Chest wall movement, Good air movement bilaterally, CTAB.  7. RRR, No Gallops, Rubs or Murmurs, No Parasternal Heave.  8. Positive Bowel Sounds, Abdomen Soft, small peri umbilical hernia, No tenderness, No organomegaly appriciated,No rebound -guarding or rigidity.  9.  No Cyanosis, Normal Skin Turgor, No Skin Rash or Bruise.  10. Good muscle tone,  joints appear normal , no effusions, Normal ROM.  11. No Palpable Lymph Nodes in Neck or Axillae     Data Review  CBC  Recent Labs Lab 06/21/14 1055  WBC 13.5*  HGB 18.5*  HCT 53.3*  PLT 268  MCV 88.7  MCH 30.8  MCHC 34.7  RDW 12.9  LYMPHSABS 1.6  MONOABS 0.4  EOSABS 0.1  BASOSABS 0.0   ------------------------------------------------------------------------------------------------------------------  Chemistries   Recent Labs Lab 06/21/14 1055  NA 139  K 4.0  CL 102  CO2 19  GLUCOSE 249*  BUN 15  CREATININE 0.96  CALCIUM 8.9  AST 60*  ALT 66*  ALKPHOS 63  BILITOT 0.8   ------------------------------------------------------------------------------------------------------------------ estimated creatinine clearance is 153.9 mL/min (by C-G formula based on Cr of 0.96). ------------------------------------------------------------------------------------------------------------------ No results for input(s): TSH, T4TOTAL, T3FREE, THYROIDAB in the last 72  hours.  Invalid input(s): FREET3   Coagulation profile No results for input(s): INR, PROTIME in the last 168 hours. ------------------------------------------------------------------------------------------------------------------- No results for input(s): DDIMER in the last 72 hours. -------------------------------------------------------------------------------------------------------------------  Cardiac Enzymes No results for input(s): CKMB, TROPONINI, MYOGLOBIN in the last 168 hours.  Invalid input(s): CK ------------------------------------------------------------------------------------------------------------------ Invalid input(s): POCBNP   ---------------------------------------------------------------------------------------------------------------  Urinalysis    Component Value Date/Time   COLORURINE YELLOW 06/21/2014 1157   APPEARANCEUR CLOUDY* 06/21/2014 1157   LABSPEC 1.036* 06/21/2014 1157   PHURINE 5.0 06/21/2014 1157   GLUCOSEU 100* 06/21/2014 1157  GLUCOSEU NEGATIVE 04/16/2014 1618   HGBUR NEGATIVE 06/21/2014 1157   HGBUR negative 07/22/2009 1319   BILIRUBINUR SMALL* 06/21/2014 1157   KETONESUR 15* 06/21/2014 1157   PROTEINUR 100* 06/21/2014 1157   UROBILINOGEN 0.2 06/21/2014 1157   NITRITE NEGATIVE 06/21/2014 1157   LEUKOCYTESUR TRACE* 06/21/2014 1157    ----------------------------------------------------------------------------------------------------------------  Imaging results:   Ct Abdomen Pelvis W Contrast  06/21/2014   CLINICAL DATA:  Diffuse abdominal pain with vomiting and diarrhea.Generalized abdominal pain R10.84 (ICD-10-CM)  EXAM: CT ABDOMEN AND PELVIS WITH CONTRAST  TECHNIQUE: Multidetector CT imaging of the abdomen and pelvis was performed using the standard protocol following bolus administration of intravenous contrast.  CONTRAST:  143mL OMNIPAQUE IOHEXOL 300 MG/ML  SOLN  COMPARISON:  11/11/2009  FINDINGS: Lung bases are clear.   Negative for free air.  Complex ventral hernias containing fat. No bowel involved within the ventral hernias.  Diffuse low density of the liver is compatible with hepatic steatosis. Trace amount of free fluid along the inferior liver. Gallbladder has been removed. Portal venous system is patent. Normal appearance of the pancreas spleen, adrenal glands and both kidneys.  Normal appearance of the prostate and urinary bladder. Right inguinal hernia containing fat. No significant abdominal or pelvic lymphadenopathy.  Small amount of free fluid in the right lower quadrant of the abdomen. Small amount of fluid along the left paracolic gutter and perisplenic region. The cecum is located high in the right abdomen. There is small amount of mesenteric fluid. There are many thick-walled loops of small bowel in the mid and right abdomen. Mild dilatation of the proximal small bowel loops and the thickened distal small bowel loops. No gross abnormality to the terminal ileum. There appears to be decompressed small bowel loops in the mid abdomen on sequence 2, image 75.  There is a stable sclerotic lesion in the right iliac bone which is likely benign based on the stability. 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 Multilevel vacuum disc phenomenon.  IMPRESSION: Large amount of thickened small bowel. Findings are concerning for an inflammatory or infectious etiology. Small amount of fluid within the abdomen and mild mesenteric edema. There is dilatation of some small bowel loops but there are decompressed small bowel loops in the mid abdomen and near the terminal ileum. Unusual configuration for a mechanical small bowel obstruction but cannot exclude at least a partial obstruction or ileus pattern.  Complex periumbilical ventral hernia containing fat. No bowel involvement.  Hepatic steatosis.   Electronically Signed   By: Markus Daft M.D.   On: 06/21/2014 13:22         Assessment &  Plan    1. Gastroenteritis versus inflammatory small bowel with possible small bowel obstruction. Also question of mechanical obstruction on CT scan with history of perioperative umbilical hernia repair in the past, general surgery has been requested to see the patient by ER, will admit to a MedSurg bed, bowel rest, IV fluids, stool cultures, C. difficile PCR, empiric IV Cipro Flagyl, supportive care and monitor.   2. DM type II. Check A1c, hold oral medications including Glucophage, every 4 hours sliding scale   3. Essential hypertension. Continue home medications and monitor.   4.OSA - CPAP at night .    DVT Prophylaxis Heparin   AM Labs Ordered, also please review Full Orders  Family Communication: Admission, patients condition and plan of care including tests being ordered have been discussed with the patient and wife who indicate understanding  and agree with the plan and Code Status.  Code Status full  Likely DC to  Home  Condition Fair  Time spent in minutes : 35    [place] Marchiano K M.D on 06/21/2014 at 2:23 PM  Between 7am to 7pm - Pager - [phone]  After 7pm go to [email or web address] - password [email or web address]  And look for the night coverage person covering me after hours  [facility] Office  [phone]

## 2014-06-21 NOTE — Plan of Care (Signed)
Problem: Phase I Progression Outcomes Goal: Voiding-avoid urinary catheter unless indicated Outcome: Completed/Met Date Met:  06/21/14     

## 2014-06-21 NOTE — Consult Note (Signed)
Reason for Consult:Abd pain/diarrhea/emesis Referring Physician: Dr. Domenick Gong Scott Avery is an 48 y.o. male.  HPI: Patient is a 48 year old male who comes in today secondary to abdominal pain, diarrhea, nausea vomiting for 5 days. Patient states that he has had this every day. He states he's had some sluggishness, lightheadedness, and syncopal-like episodes. Secondary to this the patient came to the ER for further evaluation.  Upon evaluation in the ER patient underwent CT scan which revealed thickened loops of small bowel and dilated loops. There is minimal amount of free fluid.  Patient states he's had no other sick contacts.   Past Medical History  Diagnosis Date  . Hypertension   . Sleep apnea   . Diabetes mellitus without complication     Past Surgical History  Procedure Laterality Date  . Cholecystectomy    . Umbilical surgery      after #1  . Lipoma excision       L forearm  . Wisdom tooth extraction      Family History  Problem Relation Age of Onset  . Heart disease Father     MI @ 65 in context of PNA  . Hypertension Father   . Leukemia Maternal Grandfather   . Prostate cancer Paternal Grandfather   . Diabetes Paternal Grandfather   . Stroke Neg Hx     Social History:  reports that he has quit smoking. His smoking use included Cigarettes. He smoked 0.00 packs per day. He has never used smokeless tobacco. He reports that he does not drink alcohol or use illicit drugs.  Allergies: No Known Allergies  Medications: I have reviewed the patient's current medications.  Results for orders placed or performed during the hospital encounter of 06/21/14 (from the past 48 hour(s))  CBC with Differential     Status: Abnormal   Collection Time: 06/21/14 10:55 AM  Result Value Ref Range   WBC 13.5 (H) 4.0 - 10.5 K/uL   RBC 6.01 (H) 4.22 - 5.81 MIL/uL   Hemoglobin 18.5 (H) 13.0 - 17.0 g/dL   HCT 53.3 (H) 39.0 - 52.0 %   MCV 88.7 78.0 - 100.0 fL   MCH 30.8 26.0 -  34.0 pg   MCHC 34.7 30.0 - 36.0 g/dL   RDW 12.9 11.5 - 15.5 %   Platelets 268 150 - 400 K/uL   Neutrophils Relative % 84 (H) 43 - 77 %   Neutro Abs 11.4 (H) 1.7 - 7.7 K/uL   Lymphocytes Relative 12 12 - 46 %   Lymphs Abs 1.6 0.7 - 4.0 K/uL   Monocytes Relative 3 3 - 12 %   Monocytes Absolute 0.4 0.1 - 1.0 K/uL   Eosinophils Relative 1 0 - 5 %   Eosinophils Absolute 0.1 0.0 - 0.7 K/uL   Basophils Relative 0 0 - 1 %   Basophils Absolute 0.0 0.0 - 0.1 K/uL  Comprehensive metabolic panel     Status: Abnormal   Collection Time: 06/21/14 10:55 AM  Result Value Ref Range   Sodium 139 137 - 147 mEq/L   Potassium 4.0 3.7 - 5.3 mEq/L   Chloride 102 96 - 112 mEq/L   CO2 19 19 - 32 mEq/L   Glucose, Bld 249 (H) 70 - 99 mg/dL   BUN 15 6 - 23 mg/dL   Creatinine, Ser 0.96 0.50 - 1.35 mg/dL   Calcium 8.9 8.4 - 10.5 mg/dL   Total Protein 7.0 6.0 - 8.3 g/dL   Albumin 3.5 3.5 -  5.2 g/dL   AST 60 (H) 0 - 37 U/L    Comment: HEMOLYSIS AT THIS LEVEL MAY AFFECT RESULT   ALT 66 (H) 0 - 53 U/L   Alkaline Phosphatase 63 39 - 117 U/L   Total Bilirubin 0.8 0.3 - 1.2 mg/dL   GFR calc non Af Amer >90 >90 mL/min   GFR calc Af Amer >90 >90 mL/min    Comment: (NOTE) The eGFR has been calculated using the CKD EPI equation. This calculation has not been validated in all clinical situations. eGFR's persistently <90 mL/min signify possible Chronic Kidney Disease.    Anion gap 18 (H) 5 - 15  Lipase, blood     Status: None   Collection Time: 06/21/14 10:55 AM  Result Value Ref Range   Lipase 51 11 - 59 U/L  I-Stat CG4 Lactic Acid, ED     Status: None   Collection Time: 06/21/14 11:12 AM  Result Value Ref Range   Lactic Acid, Venous 1.83 0.5 - 2.2 mmol/L  Urinalysis, Routine w reflex microscopic     Status: Abnormal   Collection Time: 06/21/14 11:57 AM  Result Value Ref Range   Color, Urine YELLOW YELLOW   APPearance CLOUDY (A) CLEAR   Specific Gravity, Urine 1.036 (H) 1.005 - 1.030   pH 5.0 5.0 - 8.0    Glucose, UA 100 (A) NEGATIVE mg/dL   Hgb urine dipstick NEGATIVE NEGATIVE   Bilirubin Urine SMALL (A) NEGATIVE   Ketones, ur 15 (A) NEGATIVE mg/dL   Protein, ur 100 (A) NEGATIVE mg/dL   Urobilinogen, UA 0.2 0.0 - 1.0 mg/dL   Nitrite NEGATIVE NEGATIVE   Leukocytes, UA TRACE (A) NEGATIVE  Urine microscopic-add on     Status: Abnormal   Collection Time: 06/21/14 11:57 AM  Result Value Ref Range   WBC, UA 0-2 <3 WBC/hpf   Casts HYALINE CASTS (A) NEGATIVE   Urine-Other AMORPHOUS URATES/PHOSPHATES     Ct Abdomen Pelvis W Contrast  06/21/2014   CLINICAL DATA:  Diffuse abdominal pain with vomiting and diarrhea.Generalized abdominal pain R10.84 (ICD-10-CM)  EXAM: CT ABDOMEN AND PELVIS WITH CONTRAST  TECHNIQUE: Multidetector CT imaging of the abdomen and pelvis was performed using the standard protocol following bolus administration of intravenous contrast.  CONTRAST:  162m OMNIPAQUE IOHEXOL 300 MG/ML  SOLN  COMPARISON:  11/11/2009  FINDINGS: Lung bases are clear.  Negative for free air.  Complex ventral hernias containing fat. No bowel involved within the ventral hernias.  Diffuse low density of the liver is compatible with hepatic steatosis. Trace amount of free fluid along the inferior liver. Gallbladder has been removed. Portal venous system is patent. Normal appearance of the pancreas spleen, adrenal glands and both kidneys.  Normal appearance of the prostate and urinary bladder. Right inguinal hernia containing fat. No significant abdominal or pelvic lymphadenopathy.  Small amount of free fluid in the right lower quadrant of the abdomen. Small amount of fluid along the left paracolic gutter and perisplenic region. The cecum is located high in the right abdomen. There is small amount of mesenteric fluid. There are many thick-walled loops of small bowel in the mid and right abdomen. Mild dilatation of the proximal small bowel loops and the thickened distal small bowel loops. No gross abnormality to  the terminal ileum. There appears to be decompressed small bowel loops in the mid abdomen on sequence 2, image 75.  There is a stable sclerotic lesion in the right iliac bone which is likely benign based on the  stability. 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 Multilevel vacuum disc phenomenon.  IMPRESSION: Large amount of thickened small bowel. Findings are concerning for an inflammatory or infectious etiology. Small amount of fluid within the abdomen and mild mesenteric edema. There is dilatation of some small bowel loops but there are decompressed small bowel loops in the mid abdomen and near the terminal ileum. Unusual configuration for a mechanical small bowel obstruction but cannot exclude at least a partial obstruction or ileus pattern.  Complex periumbilical ventral hernia containing fat. No bowel involvement.  Hepatic steatosis.   Electronically Signed   By: Scott Avery M.D.   On: 06/21/2014 13:22    Review of Systems  Constitutional: Negative for weight loss.  HENT: Negative for ear discharge, ear pain, hearing loss and tinnitus.   Eyes: Negative for blurred vision, double vision, photophobia and pain.  Respiratory: Negative for cough, sputum production and shortness of breath.   Cardiovascular: Negative for chest pain.  Gastrointestinal: Positive for nausea, vomiting, abdominal pain and diarrhea.  Genitourinary: Negative for dysuria, urgency, frequency and flank pain.  Musculoskeletal: Negative for myalgias, back pain, joint pain, falls and neck pain.  Neurological: Negative for dizziness, tingling, sensory change, focal weakness, loss of consciousness and headaches.  Endo/Heme/Allergies: Does not bruise/bleed easily.  Psychiatric/Behavioral: Negative for depression, memory loss and substance abuse. The patient is not nervous/anxious.    Blood pressure 132/42, pulse 88, temperature 97.6 F (36.4 C), temperature source Oral, resp. rate 16, height 6' (1.829  m), weight 367 lb (166.47 kg), SpO2 91 %. Physical Exam  Vitals reviewed. Constitutional: He is oriented to person, place, and time. He appears well-developed and well-nourished. He is cooperative. No distress. Cervical collar and nasal cannula in place.  HENT:  Head: Normocephalic and atraumatic. Head is without raccoon's eyes, without Battle's sign, without abrasion, without contusion and without laceration.  Right Ear: Hearing, tympanic membrane, external ear and ear canal normal. No lacerations. No drainage or tenderness. No foreign bodies. Tympanic membrane is not perforated. No hemotympanum.  Left Ear: Hearing, tympanic membrane, external ear and ear canal normal. No lacerations. No drainage or tenderness. No foreign bodies. Tympanic membrane is not perforated. No hemotympanum.  Nose: Nose normal. No nose lacerations, sinus tenderness, nasal deformity or nasal septal hematoma. No epistaxis.  Mouth/Throat: Uvula is midline, oropharynx is clear and moist and mucous membranes are normal. No lacerations.  Eyes: Conjunctivae, EOM and lids are normal. Pupils are equal, round, and reactive to light. No scleral icterus.  Neck: Trachea normal. No JVD present. No spinous process tenderness and no muscular tenderness present. Carotid bruit is not present. No thyromegaly present.  Cardiovascular: Normal rate, regular rhythm, normal heart sounds, intact distal pulses and normal pulses.   Respiratory: Effort normal and breath sounds normal. No respiratory distress. He exhibits no tenderness, no bony tenderness, no laceration and no crepitus.  GI: Soft. Normal appearance and bowel sounds are normal. He exhibits no distension. There is no tenderness. There is no rigidity, no rebound, no guarding and no CVA tenderness.  Musculoskeletal: Normal range of motion. He exhibits no edema or tenderness.  Lymphadenopathy:    He has no cervical adenopathy.  Neurological: He is alert and oriented to person, place, and  time. He has normal strength. No cranial nerve deficit or sensory deficit. GCS eye subscore is 4. GCS verbal subscore is 5. GCS motor subscore is 6.  Skin: Skin is warm, dry and intact. He is not diaphoretic.  Psychiatric: He has a normal mood and affect. His speech is normal and behavior is normal.    Assessment/Plan: 48 year old male with likely gastroenteritis and dehydration.  1. No concern for surgical abdomen. 2. IV fluid rehydration  Rosario Jacks., Anne Hahn 06/21/2014, 2:35 PM

## 2014-06-21 NOTE — ED Provider Notes (Signed)
CSN: [id number]     Arrival date & time 06/21/14  1012 History   First MD Initiated Contact with Patient 06/21/14 1045     Chief Complaint  Patient presents with  . Emesis  . Abdominal Pain  . Diarrhea  . Dizziness  . Pruritis      HPI Patient presents to the emergency department with complaints of nausea vomiting diarrhea over the past 5 days.  His symptoms were present for 2-3 days and then nearly resolved for 24 hours and now returned since yesterday.  He reports nonbloody nonbilious vomiting.  Denies blood in his stool.  Reports abdominal discomfort and pain associated with this.  He has a history of periumbilical hernia status post surgery.  He reports his pain is crampy and upper abdomen.  No similar symptoms.  No recent sick contacts.  He presents emergency department today because he changed   Past Medical History  Diagnosis Date  . Hypertension   . Sleep apnea   . Diabetes mellitus without complication    Past Surgical History  Procedure Laterality Date  . Cholecystectomy    . Umbilical surgery      after #1  . Lipoma excision       L forearm  . Wisdom tooth extraction     Family History  Problem Relation Age of Onset  . Heart disease Father     MI @ 73 in context of PNA  . Hypertension Father   . Leukemia Maternal Grandfather   . Prostate cancer Paternal Grandfather   . Diabetes Paternal Grandfather   . Stroke Neg Hx    History  Substance Use Topics  . Smoking status: Former Smoker    Types: Cigarettes  . Smokeless tobacco: Never Used     Comment: 1/3 ppd  . Alcohol Use: No    Review of Systems  All other systems reviewed and are negative.     Allergies  Review of patient's allergies indicates no known allergies.  Home Medications   Prior to Admission medications   Medication Sig Start Date End Date Taking? Authorizing Provider  amLODipine (NORVASC) 5 MG tablet TAKE 1 TABLET BY MOUTH EVERY DAY   Yes Hendricks Limes, MD  ibuprofen  (ADVIL,MOTRIN) 400 MG tablet Take 400 mg by mouth every 6 (six) hours as needed for fever or moderate pain.   Yes Historical Provider, MD  labetalol (NORMODYNE) 300 MG tablet TAKE 1 TABLET BY MOUTH TWICE A DAY   Yes Hendricks Limes, MD  meloxicam (MOBIC) 15 MG tablet Take 1 tablet (15 mg total) by mouth daily. 03/26/14  Yes Lyndal Pulley, DO  metFORMIN (GLUCOPHAGE) 500 MG tablet Take 1 tablet (500 mg total) by mouth 2 (two) times daily with a meal. 04/16/14  Yes Hendricks Limes, MD  omeprazole (PRILOSEC) 20 MG capsule Take 20 mg by mouth daily.   Yes Historical Provider, MD  traMADol (ULTRAM) 50 MG tablet Take 1 tablet (50 mg total) by mouth at bedtime as needed. Patient not taking: Reported on 06/21/2014 05/17/14   Lyndal Pulley, DO   BP 132/42 mmHg  Pulse 88  Temp(Src) 97.6 F (36.4 C) (Oral)  Resp 16  Ht 6' (1.829 m)  Wt 367 lb (166.47 kg)  BMI 49.76 kg/m2  SpO2 91% Physical Exam  Constitutional: He is oriented to person, place, and time. He appears well-developed and well-nourished.  HENT:  Head: Normocephalic and atraumatic.  Eyes: EOM are normal.  Neck: Normal range of  motion.  Cardiovascular: Normal rate, regular rhythm, normal heart sounds and intact distal pulses.   Pulmonary/Chest: Effort normal and breath sounds normal. No respiratory distress.  Abdominal: Soft. He exhibits no distension.  Mild generalized abdominal tenderness without guarding or rebound  Musculoskeletal: Normal range of motion.  Neurological: He is alert and oriented to person, place, and time.  Skin: Skin is warm and dry.  Psychiatric: He has a normal mood and affect. Judgment normal.  Nursing note and vitals reviewed.   ED Course  Procedures (including critical care time) Labs Review Labs Reviewed  CBC WITH DIFFERENTIAL - Abnormal; Notable for the following:    WBC 13.5 (*)    RBC 6.01 (*)    Hemoglobin 18.5 (*)    HCT 53.3 (*)    Neutrophils Relative % 84 (*)    Neutro Abs 11.4 (*)     All other components within normal limits  COMPREHENSIVE METABOLIC PANEL - Abnormal; Notable for the following:    Glucose, Bld 249 (*)    AST 60 (*)    ALT 66 (*)    Anion gap 18 (*)    All other components within normal limits  URINALYSIS, ROUTINE W REFLEX MICROSCOPIC - Abnormal; Notable for the following:    APPearance CLOUDY (*)    Specific Gravity, Urine 1.036 (*)    Glucose, UA 100 (*)    Bilirubin Urine SMALL (*)    Ketones, ur 15 (*)    Protein, ur 100 (*)    Leukocytes, UA TRACE (*)    All other components within normal limits  URINE MICROSCOPIC-ADD ON - Abnormal; Notable for the following:    Casts HYALINE CASTS (*)    All other components within normal limits  CLOSTRIDIUM DIFFICILE BY PCR  STOOL CULTURE  CLOSTRIDIUM DIFFICILE BY PCR  LIPASE, BLOOD  I-STAT CG4 LACTIC ACID, ED    Imaging Review Ct Abdomen Pelvis W Contrast  06/21/2014   CLINICAL DATA:  Diffuse abdominal pain with vomiting and diarrhea.Generalized abdominal pain R10.84 (ICD-10-CM)  EXAM: CT ABDOMEN AND PELVIS WITH CONTRAST  TECHNIQUE: Multidetector CT imaging of the abdomen and pelvis was performed using the standard protocol following bolus administration of intravenous contrast.  CONTRAST:  127mL OMNIPAQUE IOHEXOL 300 MG/ML  SOLN  COMPARISON:  11/11/2009  FINDINGS: Lung bases are clear.  Negative for free air.  Complex ventral hernias containing fat. No bowel involved within the ventral hernias.  Diffuse low density of the liver is compatible with hepatic steatosis. Trace amount of free fluid along the inferior liver. Gallbladder has been removed. Portal venous system is patent. Normal appearance of the pancreas spleen, adrenal glands and both kidneys.  Normal appearance of the prostate and urinary bladder. Right inguinal hernia containing fat. No significant abdominal or pelvic lymphadenopathy.  Small amount of free fluid in the right lower quadrant of the abdomen. Small amount of fluid along the left  paracolic gutter and perisplenic region. The cecum is located high in the right abdomen. There is small amount of mesenteric fluid. There are many thick-walled loops of small bowel in the mid and right abdomen. Mild dilatation of the proximal small bowel loops and the thickened distal small bowel loops. No gross abnormality to the terminal ileum. There appears to be decompressed small bowel loops in the mid abdomen on sequence 2, image 75.  There is a stable sclerotic lesion in the right iliac bone which is likely benign based on the stability. 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 Multilevel vacuum disc phenomenon.  IMPRESSION: Large amount of thickened small bowel. Findings are concerning for an inflammatory or infectious etiology. Small amount of fluid within the abdomen and mild mesenteric edema. There is dilatation of some small bowel loops but there are decompressed small bowel loops in the mid abdomen and near the terminal ileum. Unusual configuration for a mechanical small bowel obstruction but cannot exclude at least a partial obstruction or ileus pattern.  Complex periumbilical ventral hernia containing fat. No bowel involvement.  Hepatic steatosis.   Electronically Signed   By: Markus Daft M.D.   On: 06/21/2014 13:22  I personally reviewed the imaging tests through PACS system I reviewed available ER/hospitalization records through the EMR    EKG Interpretation None      MDM   Final diagnoses:  Abdominal pain  Partial small bowel obstruction    Patient likely partial small bowel obstruction dehydration.  Patient be admitted for symptomatic control.  I do not think he needs an NG tube at this time.  Doubt complete small bowel obstruction.  Likely needs bowel rest and IV fluids.  Spoke with hospitalist who requested general surgery consultation as well.    Hoy Morn, MD 06/21/14 [phone]

## 2014-06-22 LAB — BASIC METABOLIC PANEL
ANION GAP: 15 (ref 5–15)
BUN: 11 mg/dL (ref 6–23)
CHLORIDE: 105 meq/L (ref 96–112)
CO2: 20 meq/L (ref 19–32)
Calcium: 8.1 mg/dL — ABNORMAL LOW (ref 8.4–10.5)
Creatinine, Ser: 0.89 mg/dL (ref 0.50–1.35)
GFR calc Af Amer: >90 mL/min (ref >90)
GFR calc non Af Amer: >90 mL/min (ref >90)
Glucose, Bld: 112 mg/dL — ABNORMAL HIGH (ref 70–99)
Potassium: 3.7 mEq/L (ref 3.7–5.3)
SODIUM: 140 meq/L (ref 137–147)

## 2014-06-22 LAB — GLUCOSE, CAPILLARY
GLUCOSE-CAPILLARY: 85 mg/dL (ref 70–99)
Glucose-Capillary: 116 mg/dL — ABNORMAL HIGH (ref 70–99)
Glucose-Capillary: 120 mg/dL — ABNORMAL HIGH (ref 70–99)
Glucose-Capillary: 125 mg/dL — ABNORMAL HIGH (ref 70–99)

## 2014-06-22 LAB — CBC
HCT: 42.5 % (ref 39.0–52.0)
HEMOGLOBIN: 13.9 g/dL (ref 13.0–17.0)
MCH: 29 pg (ref 26.0–34.0)
MCHC: 32.7 g/dL (ref 30.0–36.0)
MCV: 88.7 fL (ref 78.0–100.0)
Platelets: 176 10*3/uL (ref 150–400)
RBC: 4.79 MIL/uL (ref 4.22–5.81)
RDW: 13.4 % (ref 11.5–15.5)
WBC: 9.6 10*3/uL (ref 4.0–10.5)

## 2014-06-22 LAB — HEMOGLOBIN A1C
Hgb A1c MFr Bld: 6.7 % — ABNORMAL HIGH (ref <5.7)
Mean Plasma Glucose: 146 mg/dL — ABNORMAL HIGH (ref <117)

## 2014-06-22 MED ORDER — CIPROFLOXACIN HCL 500 MG PO TABS: 500.0000 mg | ORAL_TABLET | Freq: Two times a day (BID) | ORAL | Status: DC

## 2014-06-22 MED ORDER — METRONIDAZOLE 500 MG PO TABS: 500.0000 mg | ORAL_TABLET | Freq: Three times a day (TID) | ORAL | Status: DC

## 2014-06-22 MED ORDER — METFORMIN HCL 500 MG PO TABS: 500.0000 mg | ORAL_TABLET | Freq: Two times a day (BID) | ORAL | Status: DC

## 2014-06-22 NOTE — Progress Notes (Signed)
06/22/14 Patient going home today, IV site removed, and discharge instructions reviewed with patient.

## 2014-06-22 NOTE — Discharge Instructions (Signed)
Follow with Primary MD Unice Cobble, MD in 7 days   Get CBC, CMP, 2 view Chest X ray checked  by Primary MD next visit.    Activity: As tolerated with Full fall precautions use walker/cane & assistance as needed   Disposition Home     Diet: Heart Healthy Low Carb  For Heart failure patients - Check your Weight same time everyday, if you gain over 2 pounds, or you develop in leg swelling, experience more shortness of breath or chest pain, call your Primary MD immediately. Follow Cardiac Low Salt Diet and 1.8 lit/day fluid restriction.   On your next visit with your primary care physician please Get Medicines reviewed and adjusted.   Please request your Prim.MD to go over all Hospital Tests and Procedure/Radiological results at the follow up, please get all Hospital records sent to your Prim MD by signing hospital release before you go home.   If you experience worsening of your admission symptoms, develop shortness of breath, life threatening emergency, suicidal or homicidal thoughts you must seek medical attention immediately by calling 911 or calling your MD immediately  if symptoms less severe.  You Must read complete instructions/literature along with all the possible adverse reactions/side effects for all the Medicines you take and that have been prescribed to you. Take any new Medicines after you have completely understood and accpet all the possible adverse reactions/side effects.   Do not drive, operating heavy machinery, perform activities at heights, swimming or participation in water activities or provide baby sitting services if your were admitted for syncope or siezures until you have seen by Primary MD or a Neurologist and advised to do so again.  Do not drive when taking Pain medications.    Do not take more than prescribed Pain, Sleep and Anxiety Medications  Special Instructions: If you have smoked or chewed Tobacco  in the last 2 yrs please stop smoking, stop any  regular Alcohol  and or any Recreational drug use.  Wear Seat belts while driving.   Please note  You were cared for by a hospitalist during your hospital stay. If you have any questions about your discharge medications or the care you received while you were in the hospital after you are discharged, you can call the unit and asked to speak with the hospitalist on call if the hospitalist that took care of you is not available. Once you are discharged, your primary care physician will handle any further medical issues. Please note that NO REFILLS for any discharge medications will be authorized once you are discharged, as it is imperative that you return to your primary care physician (or establish a relationship with a primary care physician if you do not have one) for your aftercare needs so that they can reassess your need for medications and monitor your lab values.          Metformin and X-ray Contrast Studies For some X-ray exams, a contrast dye is used. Contrast dye is a type of medicine used to make the X-ray image clearer. The contrast dye is given to the patient through a vein (intravenously). If you need to have this type of X-ray exam and you take a medication called metformin, your caregiver may have you stop taking metformin before the exam.  LACTIC ACIDOSIS In rare cases, a serious medical condition called lactic acidosis can develop in people who take metformin and receive contrast dye. The following conditions can increase the risk of this complication:  Kidney failure.  Liver problems.  Certain types of heart problems such as:  Heart failure.  Heart attack.  Heart infection.  Heart valve problems.  Alcohol abuse. If left untreated, lactic acidosis can lead to coma.  SYMPTOMS OF LACTIC ACIDOSIS Symptoms of lactic acidosis can include:  Rapid breathing (hyperventilation).  Neurologic symptoms such as:  Headaches.  Confusion.  Dizziness.  Excessive  sweating.  Feeling sick to your stomach (nauseous) or throwing up (vomiting). AFTER THE X-RAY EXAM  Stay well-hydrated. Drink fluids as instructed by your caregiver.  If you have a risk of developing lactic acidosis, blood tests may be done to make sure your kidney function is okay.  Metformin is usually stopped for 48 hours after the X-ray exam. Ask your caregiver when you can start taking metformin again. SEEK MEDICAL CARE IF:   You have shortness of breath or difficulty breathing.  You develop a headache that does not go away.  You have nausea or vomiting.  You urinate more than normal.  You develop a skin rash and have:  Redness.  Swelling.  Itching. Document Released: 06/30/2009 Document Revised: 10/04/2011 Document Reviewed: 06/30/2009 [facility] Patient Information 2015 [place], [place]. This information is not intended to replace advice given to you by your health care provider. Make sure you discuss any questions you have with your health care provider.

## 2014-06-22 NOTE — Discharge Summary (Signed)
Scott Avery, is a 48 y.o. male  DOB 06-27-1966  MRN [id number].  Admission date:  06/21/2014  Admitting Physician  Thurnell Lose, MD  Discharge Date:  06/22/2014   Primary MD  Unice Cobble, MD  Recommendations for primary care physician for things to follow:   Check CBC, CMP, final stool culture results  in 3-4 days, GI follow up in 1 week for Fatty Liver    Admission Diagnosis  Partial small bowel obstruction [K56.69] Abdominal pain [R10.9]   Discharge Diagnosis  Partial small bowel obstruction [K56.69] Abdominal pain [R10.9]    Principal Problem:   SBO (small bowel obstruction) Active Problems:   Essential hypertension   GERD   Sleep apnea   Morbid obesity with BMI of 50.0-59.9, adult   Hypertriglyceridemia   DM2 (diabetes mellitus, type 2)   Partial small bowel obstruction   Gastroenteritis      Past Medical History  Diagnosis Date  . Hypertension   . Childhood asthma   . OSA on CPAP   . Type II diabetes mellitus dx'd ~ 03/2014  . Arthritis     "hands; right hip" (06/21/2014)    Past Surgical History  Procedure Laterality Date  . Lipoma excision Left ~ 2010    [facility]  . Wisdom tooth extraction    . Hernia repair    . Laparoscopic incisional / umbilical / ventral hernia repair  ~ 2007    2 yrs S/P chole  . Laparoscopic cholecystectomy  2005       History of present illness and  Hospital Course:     Kindly see H&P for history of present illness and admission details, please review complete Labs, Consult reports and Test reports for all details in brief  HPI  from the history and physical done on the day of admission   Scott Avery is a 48 y.o. male, with history of hypertension, type 2 diabetes mellitus recently diagnosed, obstructive sleep apnea, history of a medical hernia surgery a  few years ago comes into the hospital with 6-7 day history of nausea vomiting and diarrhea along with periumbilical pain which is dull, constant, worse after eating food better with bowel rest, symptoms continued so he came to the ER where a CT scan of the abdomen and pelvis showed changes suggestive of small bowel thickening suspicious for inflammation/infection along with possible small bowel obstruction. No fever or chills, no chest pain cough and shortness of breath, no exposure to antibiotics or food items which are bad, no other family members sick.  In the ER he has some periumbilical cramping, mild nausea, ongoing diarrhea, does not appear toxic.   Hospital Course    1. Gastroenteritis versus inflammatory small bowel with possible small bowel obstruction. Also question of mechanical obstruction on CT scan with history of perioperative umbilical hernia repair in the past - seen by general surgery, bowel obstruction ruled out clinically, this likely was gastroenteritis, much improved after Cipro Flagyl 2 doses, continue Cipro Flagyl orally for 5 more days,  C. difficile PCR is negative, will request PCP to kindly monitor final stool culture results and asked visit.   2. DM type II.  hold oral medications including Glucophage 2 days secondary to IV contrast. Low-carb diet. CBG monitoring by PCP.  Lab Results  Component Value Date   HGBA1C 6.7* 06/21/2014    3. Essential hypertension. Continue home medications and monitor.   4.OSA - CPAP at night .   5. Mildly elevated liver enzymes with suspected fatty liver on CT scan. Outpatient monitoring with PCP and GI.      Discharge Condition: Stable   Follow UP  Follow-up Information    Follow up with Unice Cobble, MD. Schedule an appointment as soon as possible for a visit in 3 days.   Specialty:  Internal Medicine   Contact information:   [address] [place] [address] [phone]       Follow up with Silvano Rusk,  MD. Schedule an appointment as soon as possible for a visit in 1 week.   Specialty:  Gastroenterology   Contact information:   [address] [address] [phone]         Discharge Instructions  and  Discharge Medications      Discharge Instructions    Discharge instructions    Complete by:  As directed   Follow with Primary MD Unice Cobble, MD in 7 days   Get CBC, CMP, 2 view Chest X ray checked  by Primary MD next visit.    Activity: As tolerated with Full fall precautions use walker/cane & assistance as needed   Disposition Home     Diet: Heart Healthy Low Carb  For Heart failure patients - Check your Weight same time everyday, if you gain over 2 pounds, or you develop in leg swelling, experience more shortness of breath or chest pain, call your Primary MD immediately. Follow Cardiac Low Salt Diet and 1.8 lit/day fluid restriction.   On your next visit with your primary care physician please Get Medicines reviewed and adjusted.   Please request your Prim.MD to go over all Hospital Tests and Procedure/Radiological results at the follow up, please get all Hospital records sent to your Prim MD by signing hospital release before you go home.   If you experience worsening of your admission symptoms, develop shortness of breath, life threatening emergency, suicidal or homicidal thoughts you must seek medical attention immediately by calling 911 or calling your MD immediately  if symptoms less severe.  You Must read complete instructions/literature along with all the possible adverse reactions/side effects for all the Medicines you take and that have been prescribed to you. Take any new Medicines after you have completely understood and accpet all the possible adverse reactions/side effects.   Do not drive, operating heavy machinery, perform activities at heights, swimming or participation in water activities or provide baby sitting services if your were  admitted for syncope or siezures until you have seen by Primary MD or a Neurologist and advised to do so again.  Do not drive when taking Pain medications.    Do not take more than prescribed Pain, Sleep and Anxiety Medications  Special Instructions: If you have smoked or chewed Tobacco  in the last 2 yrs please stop smoking, stop any regular Alcohol  and or any Recreational drug use.  Wear Seat belts while driving.   Please note  You were cared for by a hospitalist during your hospital stay. If you have any questions  about your discharge medications or the care you received while you were in the hospital after you are discharged, you can call the unit and asked to speak with the hospitalist on call if the hospitalist that took care of you is not available. Once you are discharged, your primary care physician will handle any further medical issues. Please note that NO REFILLS for any discharge medications will be authorized once you are discharged, as it is imperative that you return to your primary care physician (or establish a relationship with a primary care physician if you do not have one) for your aftercare needs so that they can reassess your need for medications and monitor your lab values.     Increase activity slowly    Complete by:  As directed             Medication List    STOP taking these medications        ibuprofen 400 MG tablet  Commonly known as:  ADVIL,MOTRIN     meloxicam 15 MG tablet  Commonly known as:  MOBIC      TAKE these medications        amLODipine 5 MG tablet  Commonly known as:  NORVASC  TAKE 1 TABLET BY MOUTH EVERY DAY     ciprofloxacin 500 MG tablet  Commonly known as:  CIPRO  Take 1 tablet (500 mg total) by mouth 2 (two) times daily.     labetalol 300 MG tablet  Commonly known as:  NORMODYNE  TAKE 1 TABLET BY MOUTH TWICE A DAY     metFORMIN 500 MG tablet  Commonly known as:  GLUCOPHAGE  Take 1 tablet (500 mg total) by mouth 2 (two)  times daily with a meal.  Start taking on:  06/24/2014     metroNIDAZOLE 500 MG tablet  Commonly known as:  FLAGYL  Take 1 tablet (500 mg total) by mouth 3 (three) times daily.     omeprazole 20 MG capsule  Commonly known as:  PRILOSEC  Take 20 mg by mouth daily.     traMADol 50 MG tablet  Commonly known as:  ULTRAM  Take 1 tablet (50 mg total) by mouth at bedtime as needed.          Diet and Activity recommendation: See Discharge Instructions above   Consults obtained - [facility]   Major procedures and Radiology Reports - PLEASE review detailed and final reports for all details, in brief -       Ct Abdomen Pelvis W Contrast  06/21/2014   CLINICAL DATA:  Diffuse abdominal pain with vomiting and diarrhea.Generalized abdominal pain R10.84 (ICD-10-CM)  EXAM: CT ABDOMEN AND PELVIS WITH CONTRAST  TECHNIQUE: Multidetector CT imaging of the abdomen and pelvis was performed using the standard protocol following bolus administration of intravenous contrast.  CONTRAST:  167mL OMNIPAQUE IOHEXOL 300 MG/ML  SOLN  COMPARISON:  11/11/2009  FINDINGS: Lung bases are clear.  Negative for free air.  Complex ventral hernias containing fat. No bowel involved within the ventral hernias.  Diffuse low density of the liver is compatible with hepatic steatosis. Trace amount of free fluid along the inferior liver. Gallbladder has been removed. Portal venous system is patent. Normal appearance of the pancreas spleen, adrenal glands and both kidneys.  Normal appearance of the prostate and urinary bladder. Right inguinal hernia containing fat. No significant abdominal or pelvic lymphadenopathy.  Small amount of free fluid in the right lower quadrant of the abdomen. Small amount of fluid  along the left paracolic gutter and perisplenic region. The cecum is located high in the right abdomen. There is small amount of mesenteric fluid. There are many thick-walled loops of small bowel in the mid and right abdomen. Mild  dilatation of the proximal small bowel loops and the thickened distal small bowel loops. No gross abnormality to the terminal ileum. There appears to be decompressed small bowel loops in the mid abdomen on sequence 2, image 75.  There is a stable sclerotic lesion in the right iliac bone which is likely benign based on the stability. 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 Multilevel vacuum disc phenomenon.  IMPRESSION: Large amount of thickened small bowel. Findings are concerning for an inflammatory or infectious etiology. Small amount of fluid within the abdomen and mild mesenteric edema. There is dilatation of some small bowel loops but there are decompressed small bowel loops in the mid abdomen and near the terminal ileum. Unusual configuration for a mechanical small bowel obstruction but cannot exclude at least a partial obstruction or ileus pattern.  Complex periumbilical ventral hernia containing fat. No bowel involvement.  Hepatic steatosis.   Electronically Signed   By: Markus Daft M.D.   On: 06/21/2014 13:22    Micro Results      Recent Results (from the past 240 hour(s))  Clostridium Difficile by PCR     Status: None   Collection Time: 06/21/14  2:05 PM  Result Value Ref Range Status   C difficile by pcr NEGATIVE NEGATIVE Final  Clostridium Difficile by PCR     Status: None   Collection Time: 06/21/14  3:39 PM  Result Value Ref Range Status   C difficile by pcr NEGATIVE NEGATIVE Final       Today   Subjective:   Scott Avery today has no headache,no chest abdominal pain,no new weakness tingling or numbness, feels much better wants to go home today.    Objective:   Blood pressure 114/50, pulse 62, temperature 97.9 F (36.6 C), temperature source Oral, resp. rate 18, height 6' (1.829 m), weight 165 kg (363 lb 12.1 oz), SpO2 96 %.   Intake/Output Summary (Last 24 hours) at 06/22/14 0845 Last data filed at 06/22/14 0615  Gross per 24 hour    Intake   2565 ml  Output      0 ml  Net   2565 ml    Exam Awake Alert, Oriented x 3, No new F.N deficits, Normal affect [place].AT,PERRAL Supple Neck,No JVD, No cervical lymphadenopathy appriciated.  Symmetrical Chest wall movement, Good air movement bilaterally, CTAB RRR,No Gallops,Rubs or new Murmurs, No Parasternal Heave +ve B.Sounds, Abd Soft, Non tender, No organomegaly appriciated, No rebound -guarding or rigidity. No Cyanosis, Clubbing or edema, No new Rash or bruise  Data Review   CBC w Diff: Lab Results  Component Value Date   WBC 9.6 06/22/2014   HGB 13.9 06/22/2014   HCT 42.5 06/22/2014   PLT 176 06/22/2014   LYMPHOPCT 12 06/21/2014   MONOPCT 3 06/21/2014   EOSPCT 1 06/21/2014   BASOPCT 0 06/21/2014    CMP: Lab Results  Component Value Date   NA 140 06/22/2014   K 3.7 06/22/2014   CL 105 06/22/2014   CO2 20 06/22/2014   BUN 11 06/22/2014   CREATININE 0.89 06/22/2014   CREATININE 0.79 02/08/2011   PROT 7.0 06/21/2014   ALBUMIN 3.5 06/21/2014   BILITOT 0.8 06/21/2014   ALKPHOS 63 06/21/2014   AST  60* 06/21/2014   ALT 66* 06/21/2014  .   Total Time in preparing paper work, data evaluation and todays exam - 35 minutes  Thurnell Lose M.D on 06/22/2014 at [place]  [phone]

## 2014-06-25 LAB — STOOL CULTURE

## 2014-07-29 ENCOUNTER — Encounter: Payer: Self-pay | Admitting: Internal Medicine

## 2014-07-30 ENCOUNTER — Other Ambulatory Visit (INDEPENDENT_AMBULATORY_CARE_PROVIDER_SITE_OTHER): Payer: 59

## 2014-07-30 ENCOUNTER — Encounter: Payer: Self-pay | Admitting: Internal Medicine

## 2014-07-30 ENCOUNTER — Ambulatory Visit (INDEPENDENT_AMBULATORY_CARE_PROVIDER_SITE_OTHER): Payer: 59 | Admitting: Internal Medicine

## 2014-07-30 VITALS — BP 164/100 | HR 68 | Temp 98.3°F | Ht 72.0 in | Wt 355.8 lb

## 2014-07-30 DIAGNOSIS — K429 Umbilical hernia without obstruction or gangrene: Secondary | ICD-10-CM

## 2014-07-30 DIAGNOSIS — E118 Type 2 diabetes mellitus with unspecified complications: Secondary | ICD-10-CM

## 2014-07-30 DIAGNOSIS — K529 Noninfective gastroenteritis and colitis, unspecified: Secondary | ICD-10-CM

## 2014-07-30 DIAGNOSIS — I10 Essential (primary) hypertension: Secondary | ICD-10-CM

## 2014-07-30 LAB — CBC WITH DIFFERENTIAL/PLATELET
BASOS ABS: 0 10*3/uL (ref 0.0–0.1)
Basophils Relative: 0.3 % (ref 0.0–3.0)
EOS ABS: 0.6 10*3/uL (ref 0.0–0.7)
Eosinophils Relative: 6.3 % — ABNORMAL HIGH (ref 0.0–5.0)
HEMATOCRIT: 44.5 % (ref 39.0–52.0)
Hemoglobin: 14.7 g/dL (ref 13.0–17.0)
LYMPHS ABS: 2.6 10*3/uL (ref 0.7–4.0)
Lymphocytes Relative: 27.6 % (ref 12.0–46.0)
MCHC: 33.1 g/dL (ref 30.0–36.0)
MCV: 89.3 fl (ref 78.0–100.0)
MONO ABS: 0.6 10*3/uL (ref 0.1–1.0)
Monocytes Relative: 6 % (ref 3.0–12.0)
NEUTROS PCT: 59.8 % (ref 43.0–77.0)
Neutro Abs: 5.6 10*3/uL (ref 1.4–7.7)
PLATELETS: 212 10*3/uL (ref 150.0–400.0)
RBC: 4.99 Mil/uL (ref 4.22–5.81)
RDW: 13.2 % (ref 11.5–15.5)
WBC: 9.4 10*3/uL (ref 4.0–10.5)

## 2014-07-30 LAB — BASIC METABOLIC PANEL
BUN: 10 mg/dL (ref 6–23)
CHLORIDE: 102 meq/L (ref 96–112)
CO2: 30 meq/L (ref 19–32)
Calcium: 9 mg/dL (ref 8.4–10.5)
Creatinine, Ser: 0.9 mg/dL (ref 0.4–1.5)
GFR: 98.8 mL/min (ref >60.00)
Glucose, Bld: 118 mg/dL — ABNORMAL HIGH (ref 70–99)
Potassium: 3.7 mEq/L (ref 3.5–5.1)
SODIUM: 138 meq/L (ref 135–145)

## 2014-07-30 MED ORDER — LOSARTAN POTASSIUM 100 MG PO TABS: 100.0000 mg | ORAL_TABLET | Freq: Every day | ORAL | Status: DC

## 2014-07-30 NOTE — Progress Notes (Signed)
Pre visit review using our clinic review tool, if applicable. No additional management support is needed unless otherwise documented below in the visit note. 

## 2014-07-30 NOTE — Progress Notes (Signed)
   Subjective:    Patient ID: Scott Avery, male    DOB: 1966/06/03, 49 y.o.   MRN: [id number]  HPI    The hospital records 11/27-11/28 were reviewed. He was hospitalized with partial small bowel obstruction.  White count was 13,500. Random glucose was 249. Liver function tests were elevated  CT scan did not show frank obstruction. General surgeon  did not feel that there was obstruction either  C. difficile study was negative.  He was diagnosed with gastroenteritis and follow-up GI appointment recommended.  Metformin has been held.  Blood pressure at home has been averaging 142/87 on is present regimen new  He has had dramatic improvement in his constipation symptoms by restricting certain foods such as corn and veins.  Review of Systems Unexplained weight loss, abdominal pain, significant dyspepsia, dysphagia, melena, rectal bleeding, or persistently small caliber stools are denied @ present.      Objective:   Physical Exam  General appearance : severe obesity; in no distress.   Eyes: No conjunctival inflammation or scleral icterus is present.  Oral exam: Dental hygiene is good. Lips and gums are healthy appearing.There is no oropharyngeal erythema or exudate noted.   Heart:  Normal rate and regular rhythm. S1 and S2 normal without gallop, murmur, click, rub or other extra sounds     Lungs:Chest clear to auscultation; no wheezes, rhonchi,rales ,or rubs present.No increased work of breathing.   Abdomen: Massive abdomen; bowel sounds normal, soft and non-tender without masses, or organomegaly .Umbilical hernia noted.  No guarding or rebound. No flank tenderness to percussion.  Vascular : all pulses equal ; no bruits present.  Skin:Warm & dry.  Intact without suspicious lesions or rashes ; no jaundice or tenting  Lymphatic: No lymphadenopathy is noted about the head, neck, axilla.           Assessment & Plan:  #1 partial SBO ;resolved. Etiology possible  gastroenteritis. R/O component of Diabetic enteropathy #2 PMH umbilical hernia repair #3 DM improved.  #4 HTN , uncontrolled Hold Metformin Check CBC & BMET GI evaluation

## 2014-07-30 NOTE — Patient Instructions (Signed)
Your next office appointment will be determined based upon review of your pending labs . Those instructions will be transmitted to you  by mail. 

## 2014-07-31 ENCOUNTER — Other Ambulatory Visit: Payer: Self-pay | Admitting: Internal Medicine

## 2014-07-31 DIAGNOSIS — E118 Type 2 diabetes mellitus with unspecified complications: Secondary | ICD-10-CM

## 2014-08-08 ENCOUNTER — Telehealth: Payer: Self-pay | Admitting: Internal Medicine

## 2014-08-08 NOTE — Telephone Encounter (Signed)
Patient has been advised

## 2014-08-08 NOTE — Telephone Encounter (Signed)
Phone call to patient. He states he was not aware he was to start another blood pressure medication, Losartan 100 mg daily. He is currently on Amlodipine 5 mg daily as well as Labetalol 300 mg twice a day. Is he to take the Losartan in addition to these two medications? Please advise again as patient was not aware he was being placed on a new medication.

## 2014-08-08 NOTE — Telephone Encounter (Signed)
This should be added to others; once BP @ goal, medications can be decreased. Minimal Blood Pressure Goal= AVERAGE < 140/90;  Ideal is an AVERAGE < 135/85. This AVERAGE should be calculated from @ least 5-7 BP readings taken @ different times of day on different days of week. You should not respond to isolated BP readings , but rather the AVERAGE for that week .Please bring your  blood pressure cuff to office visits to verify that it is reliable.It  can also be checked against the blood pressure device at the pharmacy. Finger or wrist cuffs are not dependable; an arm cuff is.

## 2014-08-08 NOTE — Telephone Encounter (Signed)
Would like call back in regards to bp med.

## 2014-08-19 ENCOUNTER — Other Ambulatory Visit: Payer: Self-pay | Admitting: Internal Medicine

## 2014-09-17 ENCOUNTER — Other Ambulatory Visit (INDEPENDENT_AMBULATORY_CARE_PROVIDER_SITE_OTHER): Payer: 59

## 2014-09-17 ENCOUNTER — Encounter: Payer: Self-pay | Admitting: Internal Medicine

## 2014-09-17 ENCOUNTER — Ambulatory Visit (INDEPENDENT_AMBULATORY_CARE_PROVIDER_SITE_OTHER): Payer: 59 | Admitting: Internal Medicine

## 2014-09-17 VITALS — BP 134/78 | HR 87 | Ht 72.0 in | Wt 346.0 lb

## 2014-09-17 DIAGNOSIS — K529 Noninfective gastroenteritis and colitis, unspecified: Secondary | ICD-10-CM

## 2014-09-17 DIAGNOSIS — K429 Umbilical hernia without obstruction or gangrene: Secondary | ICD-10-CM

## 2014-09-17 DIAGNOSIS — D721 Eosinophilia, unspecified: Secondary | ICD-10-CM

## 2014-09-17 LAB — CBC WITH DIFFERENTIAL/PLATELET
Basophils Absolute: 0 10*3/uL (ref 0.0–0.1)
Basophils Relative: 0.3 % (ref 0.0–3.0)
Eosinophils Absolute: 0.3 10*3/uL (ref 0.0–0.7)
Eosinophils Relative: 3.4 % (ref 0.0–5.0)
HEMATOCRIT: 44.3 % (ref 39.0–52.0)
Hemoglobin: 15.2 g/dL (ref 13.0–17.0)
LYMPHS ABS: 2.3 10*3/uL (ref 0.7–4.0)
Lymphocytes Relative: 24.8 % (ref 12.0–46.0)
MCHC: 34.3 g/dL (ref 30.0–36.0)
MCV: 86.2 fl (ref 78.0–100.0)
MONOS PCT: 6.8 % (ref 3.0–12.0)
Monocytes Absolute: 0.6 10*3/uL (ref 0.1–1.0)
NEUTROS ABS: 6.1 10*3/uL (ref 1.4–7.7)
Neutrophils Relative %: 64.7 % (ref 43.0–77.0)
Platelets: 237 10*3/uL (ref 150.0–400.0)
RBC: 5.14 Mil/uL (ref 4.22–5.81)
RDW: 13.5 % (ref 11.5–15.5)
WBC: 9.4 10*3/uL (ref 4.0–10.5)

## 2014-09-17 MED ORDER — PROMETHAZINE HCL 25 MG PO TABS: 25.0000 mg | ORAL_TABLET | Freq: Four times a day (QID) | ORAL | Status: DC | PRN

## 2014-09-17 NOTE — Progress Notes (Signed)
Referred by Dr. Linna Darner Subjective:    Patient ID: Scott Avery, male    DOB: 07-Sep-1965, 49 y.o.   MRN: [id number] Chief complaint is prior enteritis with nausea vomiting and diarrhea HPI A she is a very nice 49 year old white man that was hospitalized in November 2015 with severe nausea vomiting and diarrhea. A CT scan showed diffusely thickened loops of small bowel with some mild ascites. He was treated with antibiotics and improved. He gives a history of similar but less severe symptoms off and on since childhood area he may go years without problems. Currently feels well though he had 1 or 2 day spell of burping of foul-smelling gas, regurgitation and nausea and vomiting and diarrhea in January. He was recently found to have a mild elevation of eosinophils on his CBC though that was not seen on prior complete blood counts in the record. He has been intentional weight. He has an umbilical hernia that is occasionally sore but does not get hard or firm. He is not having any rectal bleeding or fevers. He is avoiding corn, beans and raw vegetables as he thinks these seem to bother him and set some of his symptoms off. When he was hospitalized in the emergency department in November, prior to any medications or CT scan he did develop hives transiently. There is no chronic skin rash reported.  No Known Allergies Outpatient Prescriptions Prior to Visit  Medication Sig Dispense Refill  . amLODipine (NORVASC) 5 MG tablet TAKE 1 TABLET BY MOUTH EVERY DAY 30 tablet 5  . labetalol (NORMODYNE) 300 MG tablet TAKE 1 TABLET BY MOUTH TWICE A DAY 60 tablet 5  . losartan (COZAAR) 100 MG tablet Take 1 tablet (100 mg total) by mouth daily. 30 tablet 2  . omeprazole (PRILOSEC) 20 MG capsule Take 20 mg by mouth daily.    . ciprofloxacin (CIPRO) 500 MG tablet Take 1 tablet (500 mg total) by mouth 2 (two) times daily. 10 tablet 0  . metFORMIN (GLUCOPHAGE) 500 MG tablet Take 1 tablet (500 mg total) by mouth 2 (two)  times daily with a meal. 180 tablet 1  . metroNIDAZOLE (FLAGYL) 500 MG tablet Take 1 tablet (500 mg total) by mouth 3 (three) times daily. 15 tablet 0  . traMADol (ULTRAM) 50 MG tablet Take 1 tablet (50 mg total) by mouth at bedtime as needed. 30 tablet 0   No facility-administered medications prior to visit.   Past Medical History  Diagnosis Date  . Hypertension   . Childhood asthma   . OSA on CPAP   . Type II diabetes mellitus dx'd ~ 03/2014  . Arthritis     "hands; right hip" (06/21/2014)  . SBO (small bowel obstruction) 05/2014  . GERD (gastroesophageal reflux disease)   . Morbid obesity   . Hypertriglyceridemia   . Gastroenteritis    Past Surgical History  Procedure Laterality Date  . Lipoma excision Left ~ 2010    [facility]  . Wisdom tooth extraction    . Hernia repair    . Laparoscopic incisional / umbilical / ventral hernia repair  ~ 2007    2 yrs S/P chole  . Laparoscopic cholecystectomy  2005   History   Social History  . Marital Status: Single    Spouse Name: N/A  . Number of Children: N/A  . Years of Education: N/A   Social History Main Topics  . Smoking status: Former Smoker -- 0.50 packs/day for 20 years    Types: Cigarettes  Quit date: 12/24/2013  . Smokeless tobacco: Never Used  . Alcohol Use: Yes     Comment: 06/21/2014 "last drink was in 2012"  . Drug Use: No  . Sexual Activity: Yes   Other Topics Concern  . None   Social History Narrative   Family History  Problem Relation Age of Onset  . Heart disease Father     MI @ 34 in context of PNA  . Hypertension Father   . Leukemia Maternal Grandfather   . Prostate cancer Paternal Grandfather   . Diabetes Paternal Grandfather   . Stroke Neg Hx        Review of Systems All other review of systems negative or as per history of present illness    Objective:   Physical Exam BP 134/78 mmHg  Pulse 87  Ht 6' (1.829 m)  Wt 346 lb (156.945 kg)  BMI 46.92 kg/m2  SpO2 97% General:  Obese but  Well-developed, well-nourished and in no acute distress Eyes:  anicteric. ENT:   Mouth and posterior pharynx free of lesions.  Neck:   supple w/o thyromegaly or mass.  Lungs: Clear to auscultation bilaterally. Heart:  S1S2, no rubs, murmurs, gallops. Abdomen:  soft, non-tender, no hepatosplenomegaly,  or mass and BS+. + soft reducible umbilical hernia  Lymph:  no cervical or supraclavicular adenopathy. Extremities:   no edema Skin   no rash. Neuro:  A&O x 3.  Psych:  appropriate mood and  Affect.   Data Reviewed:  As per history of present illness I've reviewed and reviewed the images of CT scan the abdomen and pelvis from 06/21/2014, as well as labs and hospital notes from his hospitalization and primary care notes subsequently. Lab Results  Component Value Date   WBC 9.4 09/17/2014   HGB 15.2 09/17/2014   HCT 44.3 09/17/2014   MCV 86.2 09/17/2014   PLT 237.0 09/17/2014        Assessment & Plan:   1. Enteritis   2. Eosinophilia   3. Umbilical hernia without obstruction and without gangrene    I'm not certain what he had in November. It certainly could've been some sort of bacterial or viral enteritis/gastroenteritis. He seems recovered from that. There is a background of less severe chronic and intermittent similar symptoms. He hasn't eosinophilia, mild on one CBC. Eosinophilic gastroenteritis is a possibility. Eosinophilia is not always necessary for that. At this point since he feels well on P to CBC with differential, and observe. If he develops signs or symptoms of another attack, then he is to call and we will try to get [place] labs and CT scan of the abdomen and pelvis again to look at his small bowel. I don't think an upper endoscopy or colonoscopy would provide any clearly useful information at this time. Dr. Linna Darner started loratadine which might provide some relief.  The CBC was above as his last one from today and though the differential was not listed eosinophilia has  resolved.  I appreciate the opportunity to care for this patient.   CC: Unice Cobble, MD

## 2014-09-17 NOTE — Patient Instructions (Addendum)
   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 Please call me if you get a spell again and we will most likely order another CT scan and labs vs. Send you to emergency room.  Your physician has requested that you go to the basement for the following lab work before leaving today: CBC/diff  We have sent the following medications to your pharmacy for you to pick up at your convenience: Generic Phenergan  Eosinophilic Gastroenteritis Eosinophilic gastroenteritis is a rare disease in children and adults characterized by food-related reactions, infiltration of certain white blood cells (eosinophils) in the GI tract, and an increase in the number of eosinophils in the blood. The symptoms are nausea, vomiting, abdominal pain, and occasionally diarrhea. The diagnosis is confirmed by a blood test showing a high eosinophil count, and a diagnostic intestinal biopsy that shows a large number of eosinophils in the stomach and/or small intestine.  This disorder can be distinguished from food allergy by the lack of a specific offending food and the lack of a response to an elimination diet. Most cases require steroid medication (commonly prednisone) to induce remission. (Steroids are not effective in treating food allergies.)  Eosinophilic gastroenteritis, eosinophilic esophagitis (which occurs in the esophagus), and eosinophilic colitis (which occurs in the colon) make up a group of disorders called eosinophilic gastrointestinal disorders. Various symptoms may occur depending upon the portion of the GI system affected.  I appreciate the opportunity to care for you. Gatha Mayer, MD, [facility]

## 2014-10-07 ENCOUNTER — Ambulatory Visit (INDEPENDENT_AMBULATORY_CARE_PROVIDER_SITE_OTHER): Payer: 59 | Admitting: Physician Assistant

## 2014-10-07 VITALS — BP 132/82 | HR 78 | Temp 98.3°F | Resp 17 | Ht 72.0 in | Wt 335.0 lb

## 2014-10-07 DIAGNOSIS — R062 Wheezing: Secondary | ICD-10-CM | POA: Diagnosis not present

## 2014-10-07 DIAGNOSIS — J209 Acute bronchitis, unspecified: Secondary | ICD-10-CM

## 2014-10-07 DIAGNOSIS — R0981 Nasal congestion: Secondary | ICD-10-CM | POA: Diagnosis not present

## 2014-10-07 MED ORDER — AZITHROMYCIN 250 MG PO TABS: ORAL_TABLET | ORAL | Status: AC

## 2014-10-07 MED ORDER — HYDROCOD POLST-CHLORPHEN POLST 10-8 MG/5ML PO LQCR: 5.0000 mL | Freq: Two times a day (BID) | ORAL | Status: DC | PRN

## 2014-10-07 MED ORDER — IPRATROPIUM BROMIDE 0.02 % IN SOLN
0.5000 mg | Freq: Once | RESPIRATORY_TRACT | Status: AC
  Administered 2014-10-07: 0.5 mg via RESPIRATORY_TRACT

## 2014-10-07 MED ORDER — ALBUTEROL SULFATE HFA 108 (90 BASE) MCG/ACT IN AERS: 2.0000 | INHALATION_SPRAY | RESPIRATORY_TRACT | Status: DC | PRN

## 2014-10-07 MED ORDER — IPRATROPIUM BROMIDE 0.03 % NA SOLN: 2.0000 | Freq: Two times a day (BID) | NASAL | Status: DC

## 2014-10-07 MED ORDER — ALBUTEROL SULFATE (2.5 MG/3ML) 0.083% IN NEBU
2.5000 mg | INHALATION_SOLUTION | Freq: Once | RESPIRATORY_TRACT | Status: AC
  Administered 2014-10-07: 2.5 mg via RESPIRATORY_TRACT

## 2014-10-07 NOTE — Patient Instructions (Signed)
Use atrovent twice a day to relieve nasal congestion. Use albuterol as needed for wheezing, shortness of breath and chest tightness. Take antibiotic until finished. Use cough syrup at night. Use your CPAP nightly. Return in 7-10 days if your symptoms are not improving.

## 2014-10-07 NOTE — Progress Notes (Signed)
Subjective:    Patient ID: Scott Avery, male    DOB: 1966/03/29, 49 y.o.   MRN: [id number]  HPI  This is a 49 year old male with PMH HTN, HLD, OSA who is presenting with 6 days of cough and 3 days of nasal congestion. Unable to wear CPAP at night d/t congestion. States he is unable to smell or taste. His chest feels tight and he is wheezing throughout the day and night. Nasal discharge is yellow/green. Cough is productive of grey mucous. He is having night sweats and chills at night. Has not checked temperature at night. Taking alka seltzer cold and flu and not helping. No history of lung disease. He is a current smoker - 1/2 ppd. He had quit 9 months ago but started again d/t a recent relationship break-up. He has never had to use albuterol for an illness before. He denies otalgia or sore throat.  Review of Systems  Constitutional: Positive for chills. Negative for fever and fatigue.  HENT: Positive for congestion and sinus pressure. Negative for ear pain and sore throat.   Eyes: Negative for redness.  Respiratory: Positive for cough, chest tightness, shortness of breath and wheezing.   Gastrointestinal: Negative for nausea, vomiting, abdominal pain and diarrhea.  Skin: Negative for rash.  Allergic/Immunologic: Negative for environmental allergies.  Hematological: Negative for adenopathy.  Psychiatric/Behavioral: Positive for sleep disturbance.    Patient Active Problem List   Diagnosis Date Noted  . SBO (small bowel obstruction) 06/21/2014  . Lumbar radiculopathy 05/17/2014  . Microscopic hematuria 04/19/2014  . Plantar fasciitis of left foot 03/26/2014  . Type II or unspecified type diabetes mellitus without mention of complication, uncontrolled 03/23/2014  . Hypertriglyceridemia 03/23/2014  . Morbid obesity with BMI of 50.0-59.9, adult 03/13/2014  . Sleep apnea 07/31/2012  . Umbilical hernia 75/64/3329  . GERD 07/22/2009  . Essential hypertension 08/23/2007   Prior to  Admission medications   Medication Sig Start Date End Date Taking? Authorizing Provider  amLODipine (NORVASC) 5 MG tablet TAKE 1 TABLET BY MOUTH EVERY DAY 08/19/14  Yes Hendricks Limes, MD  labetalol (NORMODYNE) 300 MG tablet TAKE 1 TABLET BY MOUTH TWICE A DAY 08/19/14  Yes Hendricks Limes, MD  losartan (COZAAR) 100 MG tablet Take 1 tablet (100 mg total) by mouth daily. 07/30/14  Yes Hendricks Limes, MD  omeprazole (PRILOSEC) 20 MG capsule Take 20 mg by mouth daily.   Yes Historical Provider, MD  promethazine (PHENERGAN) 25 MG tablet Take 1 tablet (25 mg total) by mouth every 6 (six) hours as needed for nausea or vomiting. Patient not taking: Reported on 10/07/2014 09/17/14  prn Gatha Mayer, MD   No Known Allergies  Patient's social and family history were reviewed.     Objective:   Physical Exam  Constitutional: He is oriented to person, place, and time. He appears well-developed and well-nourished. No distress.  HENT:  Head: Normocephalic and atraumatic.  Right Ear: Hearing, tympanic membrane, external ear and ear canal normal.  Left Ear: Hearing, tympanic membrane, external ear and ear canal normal.  Nose: Mucosal edema present. Right sinus exhibits no maxillary sinus tenderness and no frontal sinus tenderness. Left sinus exhibits no maxillary sinus tenderness and no frontal sinus tenderness.  Mouth/Throat: Uvula is midline and mucous membranes are normal. Posterior oropharyngeal erythema present. No oropharyngeal exudate, posterior oropharyngeal edema or tonsillar abscesses.  Eyes: Conjunctivae and lids are normal. Right eye exhibits no discharge. Left eye exhibits no discharge. No scleral  icterus.  Cardiovascular: Normal rate, regular rhythm, normal heart sounds, intact distal pulses and normal pulses.   No murmur heard. Pulmonary/Chest: Effort normal. No respiratory distress. He has wheezes.  Wheezing improved throughout after duoneb treatment. Rhonchi scattered. No rales.    Musculoskeletal: Normal range of motion.  Lymphadenopathy:       Head (right side): No submental, no submandibular and no tonsillar adenopathy present.       Head (left side): No submental, no submandibular and no tonsillar adenopathy present.    He has no cervical adenopathy.  Neurological: He is alert and oriented to person, place, and time.  Skin: Skin is warm, dry and intact. No lesion and no rash noted.  Psychiatric: He has a normal mood and affect. His speech is normal and behavior is normal. Thought content normal.   BP 132/82 mmHg  Pulse 78  Temp(Src) 98.3 F (36.8 C) (Oral)  Resp 17  Ht 6' (1.829 m)  Wt 335 lb (151.955 kg)  BMI 45.42 kg/m2  SpO2 96%     Assessment & Plan:  1. Wheezing 2. Acute bronchitis 3. Nasal congestion Will tx with zpak. Albuterol for wheezing and SOB. tussionex to help sleep at night. atrovent for nasal congestion. He will return if not getting better in 7-10 days. - albuterol (PROVENTIL) (2.5 MG/3ML) 0.083% nebulizer solution 2.5 mg; Take 3 mLs (2.5 mg total) by nebulization once. - ipratropium (ATROVENT) nebulizer solution 0.5 mg; Take 2.5 mLs (0.5 mg total) by nebulization once. - albuterol (PROVENTIL HFA;VENTOLIN HFA) 108 (90 BASE) MCG/ACT inhaler; Inhale 2 puffs into the lungs every 4 (four) hours as needed for wheezing or shortness of breath (cough, shortness of breath or wheezing.).  Dispense: 1 Inhaler; Refill: 0 - azithromycin (ZITHROMAX) 250 MG tablet; Take 2 tabs PO x 1 dose, then 1 tab PO QD x 4 days  Dispense: 6 tablet; Refill: 0 - chlorpheniramine-HYDROcodone (TUSSIONEX PENNKINETIC ER) 10-8 MG/5ML LQCR; Take 5 mLs by mouth every 12 (twelve) hours as needed for cough (cough).  Dispense: 80 mL; Refill: 0 - ipratropium (ATROVENT) 0.03 % nasal spray; Place 2 sprays into both nostrils 2 (two) times daily.  Dispense: 30 mL; Refill: 0   Felicita Nuncio V. Drenda Freeze, MHS [facility]  10/07/2014

## 2014-11-02 ENCOUNTER — Other Ambulatory Visit: Payer: Self-pay | Admitting: Internal Medicine

## 2015-01-20 ENCOUNTER — Other Ambulatory Visit: Payer: Self-pay

## 2015-01-24 ENCOUNTER — Other Ambulatory Visit: Payer: Self-pay | Admitting: Internal Medicine

## 2015-01-24 NOTE — Telephone Encounter (Signed)
Amlodipine and labetelol rx sent to pharm

## 2015-02-21 ENCOUNTER — Other Ambulatory Visit: Payer: Self-pay | Admitting: Internal Medicine

## 2015-02-23 ENCOUNTER — Other Ambulatory Visit: Payer: Self-pay | Admitting: Internal Medicine

## 2015-02-24 ENCOUNTER — Other Ambulatory Visit: Payer: Self-pay

## 2015-02-24 ENCOUNTER — Telehealth: Payer: Self-pay | Admitting: Internal Medicine

## 2015-02-24 MED ORDER — LOSARTAN POTASSIUM 100 MG PO TABS: ORAL_TABLET | ORAL | Status: DC

## 2015-02-24 MED ORDER — LABETALOL HCL 300 MG PO TABS: 300.0000 mg | ORAL_TABLET | Freq: Two times a day (BID) | ORAL | Status: DC

## 2015-02-24 MED ORDER — AMLODIPINE BESYLATE 5 MG PO TABS: 5.0000 mg | ORAL_TABLET | Freq: Every day | ORAL | Status: DC

## 2015-02-24 NOTE — Telephone Encounter (Signed)
Patient had 2 refills done today but he also needs a refill for losartan (COZAAR) 100 MG tablet [[id number]. I did schedule an appointment for him on 8/24.  Pharmacy is [facility] on [address].

## 2015-03-19 ENCOUNTER — Ambulatory Visit (INDEPENDENT_AMBULATORY_CARE_PROVIDER_SITE_OTHER): Payer: 59 | Admitting: Internal Medicine

## 2015-03-19 ENCOUNTER — Encounter: Payer: Self-pay | Admitting: Internal Medicine

## 2015-03-19 ENCOUNTER — Other Ambulatory Visit (INDEPENDENT_AMBULATORY_CARE_PROVIDER_SITE_OTHER): Payer: 59

## 2015-03-19 VITALS — BP 140/84 | HR 78 | Temp 98.2°F | Resp 18 | Wt 358.0 lb

## 2015-03-19 DIAGNOSIS — E1165 Type 2 diabetes mellitus with hyperglycemia: Secondary | ICD-10-CM

## 2015-03-19 DIAGNOSIS — E669 Obesity, unspecified: Secondary | ICD-10-CM | POA: Diagnosis not present

## 2015-03-19 DIAGNOSIS — Z6841 Body Mass Index (BMI) 40.0 and over, adult: Secondary | ICD-10-CM | POA: Diagnosis not present

## 2015-03-19 DIAGNOSIS — I10 Essential (primary) hypertension: Secondary | ICD-10-CM

## 2015-03-19 LAB — BASIC METABOLIC PANEL
BUN: 17 mg/dL (ref 6–23)
CHLORIDE: 102 meq/L (ref 96–112)
CO2: 30 meq/L (ref 19–32)
Calcium: 9.5 mg/dL (ref 8.4–10.5)
Creatinine, Ser: 0.97 mg/dL (ref 0.40–1.50)
GFR: 88.06 mL/min (ref >60.00)
Glucose, Bld: 120 mg/dL — ABNORMAL HIGH (ref 70–99)
POTASSIUM: 4.2 meq/L (ref 3.5–5.1)
Sodium: 140 mEq/L (ref 135–145)

## 2015-03-19 LAB — MICROALBUMIN / CREATININE URINE RATIO
Creatinine,U: 143.8 mg/dL
Microalb Creat Ratio: 0.5 mg/g (ref 0.0–30.0)
Microalb, Ur: <0.7 mg/dL (ref 0.0–1.9)

## 2015-03-19 LAB — HEMOGLOBIN A1C: Hgb A1c MFr Bld: 6.7 % — ABNORMAL HIGH (ref 4.6–6.5)

## 2015-03-19 LAB — TSH: TSH: 1.72 u[IU]/mL (ref 0.35–4.50)

## 2015-03-19 NOTE — Progress Notes (Signed)
   Subjective:    Patient ID: Scott Avery, male    DOB: 1966-06-02, 49 y.o.   MRN: [id number]  HPI The patient is here to assess status of active health conditions.  PMH, FH, & Social History reviewed & updated.No change in [place] as recorded.  He has been compliant with his medicines without adverse effects. He states his blood pressure ranges 132-139/80-84. He is very physically active at work, walking extensively as well as lifting. He had restarted smoking a half a pack a day "due to stress"; but he states he stopped again 2-3 weeks ago.  He states that he follows a low-carb, no fried foods, no added salt diet; yet he has gained 20 pounds over the last 3 months.  He is not checking glucoses at home. His last A1c was 6.7% on 06/21/14. His lipids have not been elevated in the past.  He does have nocturia 2 times a night. He also describes diffuse myalgias and arthralgias for which he's been taking his girlfriend's naproxen.  He is on a PPI each evening before the evening meal. Despite taking the non-steroidal; he denies active GI symptoms.    Review of Systems  Chest pain, palpitations, tachycardia, exertional dyspnea, paroxysmal nocturnal dyspnea, claudication or edema are absent. No unexplained weight loss, abdominal pain, significant dyspepsia, dysphagia, melena, rectal bleeding, or persistently small caliber stools. Dysuria, pyuria, hematuria, or frequency are denied. Change in hair, skin, nails denied. No bowel changes of constipation or diarrhea. No intolerance to heat or cold. Polyuria, polyphagia, polydipsia absent.  There is no blurred vision, double vision, or loss of vision.   No postural dizziness noted. Denied are numbness, tingling, or burning of the extremities.  No nonhealing skin lesions present.     Objective:   Physical Exam  Pertinent or positive findings include: BMI 48.54. Goatee present.Faint scattered expiratory wheezing heard. Abdomen massive with ventral &  umbilical hernias.Sockline trace edema.  General appearance :adequately nourished; in no distress.  Eyes: No conjunctival inflammation or scleral icterus is present.  Oral exam:  Lips and gums are healthy appearing.There is no oropharyngeal erythema or exudate noted. Dental hygiene is good.  Heart:  Normal rate and regular rhythm. S1 and S2 normal without gallop, murmur, click, rub or other extra sounds    Lungs:No increased work of breathing.   Abdomen: bowel sounds normal, soft and non-tender without masses, or organomegaly noted.  No guarding or rebound. No flank tenderness to percussion.  Vascular : all pulses equal ; no bruits present.  Skin:Warm & dry.  Intact without suspicious lesions or rashes ; no tenting or jaundice   Lymphatic: No lymphadenopathy is noted about the head, neck, axilla  Neuro: Strength, tone & DTRs normal.         Assessment & Plan:  See Current Assessment & Plan in Problem List under specific Diagnosis

## 2015-03-19 NOTE — Assessment & Plan Note (Signed)
A1c , urine microalbumin, BMET 

## 2015-03-19 NOTE — Assessment & Plan Note (Signed)
Blood pressure goals reviewed. BMET 

## 2015-03-19 NOTE — Assessment & Plan Note (Signed)
TSH 

## 2015-03-19 NOTE — Progress Notes (Signed)
Pre visit review using our clinic review tool, if applicable. No additional management support is needed unless otherwise documented below in the visit note. 

## 2015-03-19 NOTE — Patient Instructions (Signed)
Minimal Blood Pressure Goal= AVERAGE < 140/90;  Ideal is an AVERAGE < 135/85. This AVERAGE should be calculated from @ least 5-7 BP readings taken @ different times of day on different days of week. You should not respond to isolated BP readings , but rather the AVERAGE for that week .Please bring your  blood pressure cuff to office visits to verify that it is reliable.It  can also be checked against the blood pressure device at the pharmacy. Finger or wrist cuffs are not dependable; an arm cuff is.  Reflux of gastric acid may be asymptomatic as this may occur mainly during sleep.The triggers for reflux  include stress; the "aspirin family" ; alcohol; peppermint; and caffeine (coffee, tea, cola, and chocolate). The aspirin family would include aspirin and the nonsteroidal agents such as ibuprofen &  Naproxen. Tylenol would not cause reflux. If having symptoms ; food & drink should be avoided for @ least 2 hours before going to bed.    Your next office appointment will be determined based upon review of your pending labs.  Those written interpretation of the lab results and instructions will be transmitted to you by mail for your records.  Critical results will be called.   Followup as needed for any active or acute issue. Please report any significant change in your symptoms.

## 2015-03-24 ENCOUNTER — Other Ambulatory Visit: Payer: Self-pay | Admitting: Internal Medicine

## 2015-03-24 DIAGNOSIS — E1165 Type 2 diabetes mellitus with hyperglycemia: Secondary | ICD-10-CM

## 2015-03-26 ENCOUNTER — Other Ambulatory Visit: Payer: Self-pay | Admitting: Internal Medicine

## 2015-05-05 ENCOUNTER — Telehealth: Payer: Self-pay | Admitting: Internal Medicine

## 2015-05-05 NOTE — Telephone Encounter (Signed)
Patient is advised to start on a bland diet.  He will come in and see Dr. Carlean Purl on 05/08/15 9:45

## 2015-05-08 ENCOUNTER — Ambulatory Visit: Payer: 59 | Admitting: Internal Medicine

## 2015-07-08 ENCOUNTER — Telehealth: Payer: Self-pay

## 2015-07-08 NOTE — Telephone Encounter (Signed)
Left Voice Mail for pt to call back.   RE: Flu Vaccine for 2016  

## 2015-08-01 ENCOUNTER — Emergency Department (HOSPITAL_COMMUNITY): Payer: 59

## 2015-08-01 ENCOUNTER — Telehealth: Payer: Self-pay | Admitting: Internal Medicine

## 2015-08-01 ENCOUNTER — Encounter (HOSPITAL_COMMUNITY): Payer: Self-pay

## 2015-08-01 ENCOUNTER — Emergency Department (HOSPITAL_COMMUNITY)
Admission: EM | Admit: 2015-08-01 | Discharge: 2015-08-01 | Disposition: A | Discharge status: Home / Self Care | Payer: 59 | Attending: Emergency Medicine | Admitting: Emergency Medicine

## 2015-08-01 DIAGNOSIS — Z9981 Dependence on supplemental oxygen: Secondary | ICD-10-CM | POA: Insufficient documentation

## 2015-08-01 DIAGNOSIS — G4733 Obstructive sleep apnea (adult) (pediatric): Secondary | ICD-10-CM | POA: Diagnosis not present

## 2015-08-01 DIAGNOSIS — E119 Type 2 diabetes mellitus without complications: Secondary | ICD-10-CM | POA: Insufficient documentation

## 2015-08-01 DIAGNOSIS — J45909 Unspecified asthma, uncomplicated: Secondary | ICD-10-CM | POA: Diagnosis not present

## 2015-08-01 DIAGNOSIS — Z87891 Personal history of nicotine dependence: Secondary | ICD-10-CM | POA: Insufficient documentation

## 2015-08-01 DIAGNOSIS — R109 Unspecified abdominal pain: Secondary | ICD-10-CM | POA: Insufficient documentation

## 2015-08-01 DIAGNOSIS — R197 Diarrhea, unspecified: Secondary | ICD-10-CM | POA: Insufficient documentation

## 2015-08-01 DIAGNOSIS — I10 Essential (primary) hypertension: Secondary | ICD-10-CM | POA: Insufficient documentation

## 2015-08-01 DIAGNOSIS — R112 Nausea with vomiting, unspecified: Secondary | ICD-10-CM | POA: Diagnosis not present

## 2015-08-01 DIAGNOSIS — Z8739 Personal history of other diseases of the musculoskeletal system and connective tissue: Secondary | ICD-10-CM | POA: Diagnosis not present

## 2015-08-01 DIAGNOSIS — K219 Gastro-esophageal reflux disease without esophagitis: Secondary | ICD-10-CM | POA: Insufficient documentation

## 2015-08-01 DIAGNOSIS — Z79899 Other long term (current) drug therapy: Secondary | ICD-10-CM | POA: Diagnosis not present

## 2015-08-01 DIAGNOSIS — Z9049 Acquired absence of other specified parts of digestive tract: Secondary | ICD-10-CM | POA: Diagnosis not present

## 2015-08-01 LAB — COMPREHENSIVE METABOLIC PANEL
ALBUMIN: 3.7 g/dL (ref 3.5–5.0)
ALT: 62 U/L (ref 17–63)
AST: 35 U/L (ref 15–41)
Alkaline Phosphatase: 65 U/L (ref 38–126)
Anion gap: 7 (ref 5–15)
BUN: 10 mg/dL (ref 6–20)
CHLORIDE: 105 mmol/L (ref 101–111)
CO2: 25 mmol/L (ref 22–32)
Calcium: 8.7 mg/dL — ABNORMAL LOW (ref 8.9–10.3)
Creatinine, Ser: 0.87 mg/dL (ref 0.61–1.24)
GFR calc Af Amer: >60 mL/min (ref >60)
GFR calc non Af Amer: >60 mL/min (ref >60)
GLUCOSE: 150 mg/dL — AB (ref 65–99)
POTASSIUM: 3.8 mmol/L (ref 3.5–5.1)
SODIUM: 137 mmol/L (ref 135–145)
Total Bilirubin: 0.5 mg/dL (ref 0.3–1.2)
Total Protein: 6.8 g/dL (ref 6.5–8.1)

## 2015-08-01 LAB — DIFFERENTIAL
BASOS PCT: 0 %
Basophils Absolute: 0 10*3/uL (ref 0.0–0.1)
EOS ABS: 0.7 10*3/uL (ref 0.0–0.7)
Eosinophils Relative: 5 %
Lymphocytes Relative: 17 %
Lymphs Abs: 2.1 10*3/uL (ref 0.7–4.0)
MONO ABS: 0.6 10*3/uL (ref 0.1–1.0)
MONOS PCT: 5 %
Neutro Abs: 9.3 10*3/uL — ABNORMAL HIGH (ref 1.7–7.7)
Neutrophils Relative %: 73 %

## 2015-08-01 LAB — URINALYSIS, ROUTINE W REFLEX MICROSCOPIC
GLUCOSE, UA: NEGATIVE mg/dL
HGB URINE DIPSTICK: NEGATIVE
KETONES UR: NEGATIVE mg/dL
Leukocytes, UA: NEGATIVE
Nitrite: NEGATIVE
PH: 5.5 (ref 5.0–8.0)
Protein, ur: NEGATIVE mg/dL
Specific Gravity, Urine: 1.028 (ref 1.005–1.030)

## 2015-08-01 LAB — CBC
HEMATOCRIT: 48.2 % (ref 39.0–52.0)
Hemoglobin: 16.2 g/dL (ref 13.0–17.0)
MCH: 29.9 pg (ref 26.0–34.0)
MCHC: 33.6 g/dL (ref 30.0–36.0)
MCV: 88.9 fL (ref 78.0–100.0)
Platelets: 217 10*3/uL (ref 150–400)
RBC: 5.42 MIL/uL (ref 4.22–5.81)
RDW: 13 % (ref 11.5–15.5)
WBC: 12.1 10*3/uL — ABNORMAL HIGH (ref 4.0–10.5)

## 2015-08-01 LAB — LIPASE, BLOOD: LIPASE: 19 U/L (ref 11–51)

## 2015-08-01 MED ORDER — ONDANSETRON HCL 4 MG/2ML IJ SOLN
4.0000 mg | Freq: Once | INTRAMUSCULAR | Status: AC
  Administered 2015-08-01: 4 mg via INTRAVENOUS
  Filled 2015-08-01: qty 2

## 2015-08-01 MED ORDER — SODIUM CHLORIDE 0.9 % IV BOLUS (SEPSIS)
1000.0000 mL | Freq: Once | INTRAVENOUS | Status: AC
  Administered 2015-08-01: 1000 mL via INTRAVENOUS

## 2015-08-01 NOTE — Telephone Encounter (Signed)
Patient states that he is going on to the ED.

## 2015-08-01 NOTE — ED Notes (Signed)
Pt able to tolerate fluids 

## 2015-08-01 NOTE — Discharge Instructions (Signed)
Please follow-up with her gastroenterologist next week for reevaluation. You do not need antibiotics today. Please return without fail for worsening symptoms, including worsening pain, fevers, vomiting unable to keep down food or fluids despite nausea medications, or any other symptoms concerning to you.  Nausea and Vomiting Nausea means you feel sick to your stomach. Throwing up (vomiting) is a reflex where stomach contents come out of your mouth. HOME CARE   Take medicine as told by your doctor.  Do not force yourself to eat. However, you do need to drink fluids.  If you feel like eating, eat a normal diet as told by your doctor.  Eat rice, wheat, potatoes, bread, lean meats, yogurt, fruits, and vegetables.  Avoid high-fat foods.  Drink enough fluids to keep your pee (urine) clear or pale yellow.  Ask your doctor how to replace body fluid losses (rehydrate). Signs of body fluid loss (dehydration) include:  Feeling very thirsty.  Dry lips and mouth.  Feeling dizzy.  Dark pee.  Peeing less than normal.  Feeling confused.  Fast breathing or heart rate. GET HELP RIGHT AWAY IF:   You have blood in your throw up.  You have black or bloody poop (stool).  You have a bad headache or stiff neck.  You feel confused.  You have bad belly (abdominal) pain.  You have chest pain or trouble breathing.  You do not pee at least once every 8 hours.  You have cold, clammy skin.  You keep throwing up after 24 to 48 hours.  You have a fever. MAKE SURE YOU:   Understand these instructions.  Will watch your condition.  Will get help right away if you are not doing well or get worse.   This information is not intended to replace advice given to you by your health care provider. Make sure you discuss any questions you have with your health care provider.   Document Released: 12/29/2007 Document Revised: 10/04/2011 Document Reviewed: 12/11/2010 Elsevier Interactive Patient  Education 2016 [place].  Diarrhea Diarrhea is frequent loose and watery bowel movements. It can cause you to feel weak and dehydrated. Dehydration can cause you to become tired and thirsty, have a dry mouth, and have decreased urination that often is dark yellow. Diarrhea is a sign of another problem, most often an infection that will not last long. In most cases, diarrhea typically lasts 2-3 days. However, it can last longer if it is a sign of something more serious. It is important to treat your diarrhea as directed by your caregiver to lessen or prevent future episodes of diarrhea. CAUSES  Some common causes include:  Gastrointestinal infections caused by viruses, bacteria, or parasites.  Food poisoning or food allergies.  Certain medicines, such as antibiotics, chemotherapy, and laxatives.  Artificial sweeteners and fructose.  Digestive disorders. HOME CARE INSTRUCTIONS  Ensure adequate fluid intake (hydration): Have 1 cup (8 oz) of fluid for each diarrhea episode. Avoid fluids that contain simple sugars or sports drinks, fruit juices, whole milk products, and sodas. Your urine should be clear or pale yellow if you are drinking enough fluids. Hydrate with an oral rehydration solution that you can purchase at pharmacies, retail stores, and online. You can prepare an oral rehydration solution at home by mixing the following ingredients together:   - tsp table salt.   tsp baking soda.   tsp salt substitute containing potassium chloride.  1  tablespoons sugar.  1 L (34 oz) of water.  Certain foods and beverages  may increase the speed at which food moves through the gastrointestinal (GI) tract. These foods and beverages should be avoided and include:  Caffeinated and alcoholic beverages.  High-fiber foods, such as raw fruits and vegetables, nuts, seeds, and whole grain breads and cereals.  Foods and beverages sweetened with sugar alcohols, such as xylitol, sorbitol, and  mannitol.  Some foods may be well tolerated and may help thicken stool including:  Starchy foods, such as rice, toast, pasta, low-sugar cereal, oatmeal, grits, baked potatoes, crackers, and bagels.  Bananas.  Applesauce.  Add probiotic-rich foods to help increase healthy bacteria in the GI tract, such as yogurt and fermented milk products.  Wash your hands well after each diarrhea episode.  Only take over-the-counter or prescription medicines as directed by your caregiver.  Take a warm bath to relieve any burning or pain from frequent diarrhea episodes. SEEK IMMEDIATE MEDICAL CARE IF:   You are unable to keep fluids down.  You have persistent vomiting.  You have blood in your stool, or your stools are black and tarry.  You do not urinate in 6-8 hours, or there is only a small amount of very dark urine.  You have abdominal pain that increases or localizes.  You have weakness, dizziness, confusion, or light-headedness.  You have a severe headache.  Your diarrhea gets worse or does not get better.  You have a fever or persistent symptoms for more than 2-3 days.  You have a fever and your symptoms suddenly get worse. MAKE SURE YOU:   Understand these instructions.  Will watch your condition.  Will get help right away if you are not doing well or get worse.   This information is not intended to replace advice given to you by your health care provider. Make sure you discuss any questions you have with your health care provider.   Document Released: 07/02/2002 Document Revised: 08/02/2014 Document Reviewed: 03/19/2012 Elsevier Interactive Patient Education [employer].

## 2015-08-01 NOTE — ED Notes (Signed)
Pt states his urine was dark when he urinated. Tolerated his Gatorade w/o difficulty.

## 2015-08-01 NOTE — ED Provider Notes (Signed)
CSN: [id number]     Arrival date & time 08/01/15  81 History   First MD Initiated Contact with Patient 08/01/15 1346     Chief Complaint  Patient presents with  . Abdominal Pain  . Diarrhea  . Emesis     (Consider location/radiation/quality/duration/timing/severity/associated sxs/prior Treatment) HPI 50 year old male who presents with nausea, vomiting, and diarrhea. Reports that he is initially started having diarrhea with nausea after Christmas. Symptoms improved after 4 days or so, but right after the new year began to have episodes of diarrhea nausea and intermittent vomiting. States that yesterday evening did have episode of abdominal cramping, which is now fully resolved. States that he has bloating and cramping right before diarrhea and vomiting, but then symptoms resolved. No fevers or chills. No urinary complaints. Is followed by Dr. Carlean Purl from gastroenterology, after he was diagnosed with a bout of gastroenteritis/enteritis one year ago. No recent travel or recent antibiotics.  Although nursing note states that he has a history of diverticulitis, he has no prior history of this, and states that he was admitted for one type of "-itis" in November 2015, and on chart review that was to be gastroenteritis/enteritis.   Past Medical History  Diagnosis Date  . Hypertension   . Childhood asthma   . OSA on CPAP   . Type II diabetes mellitus ([place]) dx'd ~ 03/2014  . Arthritis     "hands; right hip" (06/21/2014)  . SBO (small bowel obstruction) ([place]) 05/2014  . GERD (gastroesophageal reflux disease)   . Morbid obesity ([place])   . Hypertriglyceridemia   . Gastroenteritis    Past Surgical History  Procedure Laterality Date  . Lipoma excision Left ~ 2010    [facility]  . Wisdom tooth extraction    . Hernia repair    . Laparoscopic incisional / umbilical / ventral hernia repair  ~ 2007    2 yrs S/P chole  . Laparoscopic cholecystectomy  2005   Family History  Problem Relation Age of  Onset  . Heart disease Father     MI @ 83 in context of PNA  . Hypertension Father   . Leukemia Maternal Grandfather   . Prostate cancer Paternal Grandfather   . Diabetes Paternal Grandfather   . Stroke Neg Hx    Social History  Substance Use Topics  . Smoking status: Former Smoker -- 0.50 packs/day for 20 years    Types: Cigarettes    Quit date: 02/24/2015  . Smokeless tobacco: Never Used  . Alcohol Use: 0.0 oz/week    0 Standard drinks or equivalent per week     Comment: 06/21/2014 "last drink was in 2012"    Review of Systems 10/14 systems reviewed and are negative other than those stated in the HPI    Allergies  Review of patient's allergies indicates no known allergies.  Home Medications   Prior to Admission medications   Medication Sig Start Date End Date Taking? Authorizing Provider  amLODipine (NORVASC) 5 MG tablet Take 1 tablet (5 mg total) by mouth daily. 03/26/15  Yes Hendricks Limes, MD  diphenhydrAMINE (BENADRYL) 25 MG tablet Take 25 mg by mouth every 6 (six) hours as needed for itching.   Yes Historical Provider, MD  ipratropium (ATROVENT) 0.03 % nasal spray Place 2 sprays into both nostrils 2 (two) times daily. Patient taking differently: Place 2 sprays into both nostrils 2 (two) times daily as needed for rhinitis.  10/07/14  Yes Bennett Scrape V, PA-C  labetalol (  NORMODYNE) 300 MG tablet TAKE 1 TABLET BY MOUTH TWICE A DAY 03/26/15  Yes Hendricks Limes, MD  losartan (COZAAR) 100 MG tablet TAKE 1 TABLET (100 MG TOTAL) BY MOUTH DAILY. 02/24/15  Yes Hendricks Limes, MD  omeprazole (PRILOSEC) 20 MG capsule Take 20 mg by mouth daily.   Yes Historical Provider, MD   BP 123/78 mmHg  Pulse 64  Temp(Src) 98.2 F (36.8 C) (Oral)  Resp 18  SpO2 98% Physical Exam Physical Exam  Nursing note and vitals reviewed. Constitutional: Well developed, well nourished, non-toxic, and in no acute distress Head: Normocephalic and atraumatic.  Mouth/Throat: Oropharynx is clear and  moist.  Neck: Normal range of motion. Neck supple.  Cardiovascular: Normal rate and regular rhythm.   Pulmonary/Chest: Effort normal and breath sounds normal.  Abdominal: Soft. There is no tenderness. There is no rebound and no guarding.  Musculoskeletal: Normal range of motion.  Neurological: Alert, no facial droop, fluent speech, moves all extremities symmetrically Skin: Skin is warm and dry.  Psychiatric: Cooperative  ED Course  Procedures (including critical care time) Labs Review Labs Reviewed  COMPREHENSIVE METABOLIC PANEL - Abnormal; Notable for the following:    Glucose, Bld 150 (*)    Calcium 8.7 (*)    All other components within normal limits  CBC - Abnormal; Notable for the following:    WBC 12.1 (*)    All other components within normal limits  DIFFERENTIAL - Abnormal; Notable for the following:    Neutro Abs 9.3 (*)    All other components within normal limits  LIPASE, BLOOD  URINALYSIS, ROUTINE W REFLEX MICROSCOPIC (NOT AT [facility])    Imaging Review Dg Abd Acute W/chest  08/01/2015  CLINICAL DATA:  Nausea, vomiting, diarrhea, history of small bowel obstruction EXAM: DG ABDOMEN ACUTE W/ 1V CHEST COMPARISON:  11/ 27/15 FINDINGS: Cardiomediastinal silhouette is unremarkable. No acute infiltrate or pleural effusion. No pulmonary edema. There is normal small bowel gas pattern. Postcholecystectomy surgical clips are noted. Moderate colonic gas noted in right colon transverse colon and proximal left colon. No significant colonic distension. Mild degenerative changes lumbar spine. No free abdominal air. IMPRESSION: No acute disease within chest. Postcholecystectomy surgical clips. Normal small bowel gas pattern. Postcholecystectomy surgical clips. Moderate colonic gas within proximal colon without significant colonic distension. Electronically Signed   By: Lahoma Crocker M.D.   On: 08/01/2015 14:46   I have personally reviewed and evaluated these images and lab results as part of my  medical decision-making.   EKG Interpretation None      MDM   Final diagnoses:  Nausea vomiting and diarrhea    50 year old male who presents with nausea, vomiting, diarrhea, intermittent over the course of the past 2 weeks. On arrival is hemodynamically stable and in no acute distress. Well-appearing and appears well-hydrated on exam. Has a soft and nontender abdomen. States that he has only had one episode of abdominal cramping yesterday, and has not had any abdominal pain since then. Clinically does not appear obstructed, and x-ray of abdomen without concerning features  Of obstruction. NO abd pain and not suggestive of diverticulitis. No suspicion for serious bacterial or surgical intraabdominal process. Basic blood work including CBC, CMP, lipase unremarkable. Given IVF and antiemetics and able to tolerate PO intake. Appropriate for supportive care management at home. No antibiotics felt indicated at this time. He will follow-up with his GI physician.    Forde Dandy, MD 08/04/15 1311

## 2015-08-01 NOTE — ED Notes (Signed)
Pt c/o intermittent diarrhea since 12/26 and abdominal cramping and emesis starting this morning.  Pain score 3/10.  Hx of diverticulitis.  Pt is followed by [facility].

## 2015-08-01 NOTE — ED Notes (Signed)
Asked pt about urine specimen. Pt states he voided when in x-ray.

## 2015-08-01 NOTE — ED Notes (Signed)
Patient has tried x2 to obtain urine specimen- unsuccessful x2

## 2015-08-02 NOTE — ED Provider Notes (Signed)
Pt is feeling improved on recheck, no recurrent vomiting and tolerating Gatorade.  UA not c/w UTI.  Discussed oral fluid hydration, outpatient follow up, return precautions.   Quintella Reichert, MD 08/02/15 1251

## 2015-08-20 ENCOUNTER — Telehealth: Payer: Self-pay | Admitting: Emergency Medicine

## 2015-08-20 ENCOUNTER — Other Ambulatory Visit: Payer: Self-pay | Admitting: Internal Medicine

## 2015-08-20 NOTE — Telephone Encounter (Signed)
LVM for pt to call back to schedule an appt with new PCP. Appt can be scheduled for Feb or March. After scheduled RX for losartan will be sent to [facility]

## 2015-08-22 MED ORDER — LOSARTAN POTASSIUM 100 MG PO TABS: ORAL_TABLET | ORAL | Status: DC

## 2015-08-22 NOTE — Telephone Encounter (Signed)
Pt has an appt with Dr. Quay Burow on 10/03/15. Please send in refill

## 2015-08-22 NOTE — Addendum Note (Signed)
Addended by: Terence Lux B on: 08/22/2015 03:31 PM   Modules accepted: Orders

## 2015-08-22 NOTE — Telephone Encounter (Signed)
Refill sent.

## 2015-10-03 ENCOUNTER — Ambulatory Visit (INDEPENDENT_AMBULATORY_CARE_PROVIDER_SITE_OTHER): Payer: 59 | Admitting: Internal Medicine

## 2015-10-03 ENCOUNTER — Encounter: Payer: Self-pay | Admitting: Internal Medicine

## 2015-10-03 VITALS — BP 132/74 | HR 71 | Temp 98.6°F | Resp 16 | Wt 357.0 lb

## 2015-10-03 DIAGNOSIS — E1165 Type 2 diabetes mellitus with hyperglycemia: Secondary | ICD-10-CM

## 2015-10-03 DIAGNOSIS — I10 Essential (primary) hypertension: Secondary | ICD-10-CM | POA: Diagnosis not present

## 2015-10-03 DIAGNOSIS — K760 Fatty (change of) liver, not elsewhere classified: Secondary | ICD-10-CM | POA: Insufficient documentation

## 2015-10-03 DIAGNOSIS — G4730 Sleep apnea, unspecified: Secondary | ICD-10-CM

## 2015-10-03 DIAGNOSIS — K219 Gastro-esophageal reflux disease without esophagitis: Secondary | ICD-10-CM

## 2015-10-03 NOTE — Progress Notes (Signed)
Subjective:    Patient ID: Scott Avery, male    DOB: 1966/07/22, 50 y.o.   MRN: [id number]  HPI He is here to establish with a new pcp.   He is here for follow up.   Diabetes: He is controlling his diabetes with lifestyle. He is compliant with a diabetic diet. He is exercising regularly - walking about one mile a few times a week, which he just started. He is very active at work.  He does not currently monitor his sugars at home. He is not up-to-date with an ophthalmology examination.   Hypertension: He is taking his medication daily. He is compliant with a low sodium diet.  He denies chest pain, palpitations, edema, shortness of breath and regular headaches. He is exercising regularly - just started.  He is working on weight loss.   GERD:  He is taking his medication daily as prescribed.  He denies any GERD symptoms and feels his GERD is well controlled.   Obesity:  He is starting to exercise.  He has changed his appetite and is eating more healthy.    Medications and allergies reviewed with patient and updated if appropriate.  Patient Active Problem List   Diagnosis Date Noted  . SBO (small bowel obstruction) ([place]) 06/21/2014  . Lumbar radiculopathy 05/17/2014  . Microscopic hematuria 04/19/2014  . Plantar fasciitis of left foot 03/26/2014  . Diabetes mellitus with hyperglycemia ([place]) 03/23/2014  . Hypertriglyceridemia 03/23/2014  . Morbid obesity with BMI of 50.0-59.9, adult ([place]) 03/13/2014  . Sleep apnea 07/31/2012  . Umbilical hernia [id number]  . GERD 07/22/2009  . Essential hypertension 08/23/2007    Current Outpatient Prescriptions on File Prior to Visit  Medication Sig Dispense Refill  . amLODipine (NORVASC) 5 MG tablet Take 1 tablet (5 mg total) by mouth daily. 90 tablet 2  . ipratropium (ATROVENT) 0.03 % nasal spray Place 2 sprays into both nostrils 2 (two) times daily. (Patient taking differently: Place 2 sprays into both nostrils 2 (two) times daily as needed  for rhinitis. ) 30 mL 0  . labetalol (NORMODYNE) 300 MG tablet TAKE 1 TABLET BY MOUTH TWICE A DAY 180 tablet 2  . losartan (COZAAR) 100 MG tablet TAKE 1 TABLET (100 MG TOTAL) BY MOUTH DAILY. 30 tablet 2  . omeprazole (PRILOSEC) 20 MG capsule Take 20 mg by mouth daily.     No current facility-administered medications on file prior to visit.    Past Medical History  Diagnosis Date  . Hypertension   . Childhood asthma   . OSA on CPAP   . Type II diabetes mellitus ([place]) dx'd ~ 03/2014  . Arthritis     "hands; right hip" (06/21/2014)  . SBO (small bowel obstruction) ([place]) 05/2014  . GERD (gastroesophageal reflux disease)   . Morbid obesity ([place])   . Hypertriglyceridemia   . Gastroenteritis     Past Surgical History  Procedure Laterality Date  . Lipoma excision Left ~ 2010    [facility]  . Wisdom tooth extraction    . Hernia repair    . Laparoscopic incisional / umbilical / ventral hernia repair  ~ 2007    2 yrs S/P chole  . Laparoscopic cholecystectomy  2005    Social History   Social History  . Marital Status: Single    Spouse Name: N/A  . Number of Children: N/A  . Years of Education: N/A   Social History Main Topics  . Smoking status: Former Smoker -- 0.50 packs/day  for 20 years    Types: Cigarettes    Quit date: 02/24/2015  . Smokeless tobacco: Never Used  . Alcohol Use: 0.0 oz/week    0 Standard drinks or equivalent per week     Comment: 06/21/2014 "last drink was in 2012"  . Drug Use: No  . Sexual Activity: Yes   Other Topics Concern  . Not on file   Social History Narrative    Family History  Problem Relation Age of Onset  . Heart disease Father     MI @ 34 in context of PNA  . Hypertension Father   . Leukemia Maternal Grandfather   . Prostate cancer Paternal Grandfather   . Diabetes Paternal Grandfather   . Stroke Neg Hx     Review of Systems  Constitutional: Negative for fever and chills.  HENT: Positive for congestion.   Respiratory: Negative  for cough, shortness of breath and wheezing.   Cardiovascular: Negative for chest pain, palpitations and leg swelling.  Gastrointestinal:       GERD controlled  Neurological: Positive for numbness (? CTS in left hand). Negative for dizziness, light-headedness and headaches.       Objective:   Filed Vitals:   10/03/15 1505  BP: 132/74  Pulse: 71  Temp: 98.6 F (37 C)  Resp: 16   Filed Weights   10/03/15 1505  Weight: 357 lb (161.934 kg)   Body mass index is 48.41 kg/(m^2).   Physical Exam Constitutional: Appears well-developed and well-nourished. No distress.  Neck: Neck supple. No tracheal deviation present. No thyromegaly present.  No carotid bruit. No cervical adenopathy.   Cardiovascular: Normal rate, regular rhythm and normal heart sounds.   No murmur heard.  No edema Pulmonary/Chest: Effort normal and good air entry.  No respiratory distress. A couple of very mild wheeze at very end of expiration.       Assessment & Plan:    See Problem List for Assessment and Plan of chronic medical problems.  Follow up in 6 months - PE

## 2015-10-03 NOTE — Assessment & Plan Note (Signed)
Uses cpap nightly  

## 2015-10-03 NOTE — Assessment & Plan Note (Signed)
Controlled  Continue daily medication Working on weight loss

## 2015-10-03 NOTE — Assessment & Plan Note (Signed)
BP controlled here today Continue current medication

## 2015-10-03 NOTE — Assessment & Plan Note (Signed)
Advised weight loss.

## 2015-10-03 NOTE — Patient Instructions (Signed)
   Test(s) ordered today. Your results will be released to [facility] (or called to you) after review, usually within 72hours after test completion. If any changes need to be made, you will be notified at that same time.  All other Health Maintenance issues reviewed.   All recommended immunizations and age-appropriate screenings are up-to-date.  No immunizations administered today.   Medications reviewed and updated.  Changes include  /  No changes recommended at this time.  Your prescription(s) have been submitted to your pharmacy. Please take as directed and contact our office if you believe you are having problem(s) with the medication(s).  A referral  Please followup in

## 2015-10-03 NOTE — Progress Notes (Signed)
Patient received education resource, including the self-management goal and tool. Patient verbalized understanding. 

## 2015-10-03 NOTE — Assessment & Plan Note (Addendum)
Continue with regular exercise - increase if able  Decrease portions, healthy diet Dicussed potential risks of obesity

## 2015-10-03 NOTE — Assessment & Plan Note (Signed)
Diet controlled Continue regular exercise - increase if possible Decrease portions, healthy diet Check a1c Due to eye exam - advised him to schedule Follow up in 6 months

## 2015-11-22 ENCOUNTER — Other Ambulatory Visit: Payer: Self-pay | Admitting: Internal Medicine

## 2015-12-23 ENCOUNTER — Other Ambulatory Visit: Payer: Self-pay | Admitting: *Deleted

## 2015-12-23 MED ORDER — LABETALOL HCL 300 MG PO TABS: 300.0000 mg | ORAL_TABLET | Freq: Two times a day (BID) | ORAL | Status: DC

## 2015-12-23 MED ORDER — AMLODIPINE BESYLATE 5 MG PO TABS: 5.0000 mg | ORAL_TABLET | Freq: Every day | ORAL | Status: DC

## 2015-12-23 NOTE — Addendum Note (Signed)
Addended by: Earnstine Regal on: 12/23/2015 10:49 AM   Modules accepted: Orders

## 2016-04-06 ENCOUNTER — Other Ambulatory Visit (INDEPENDENT_AMBULATORY_CARE_PROVIDER_SITE_OTHER): Payer: BLUE CROSS/BLUE SHIELD

## 2016-04-06 ENCOUNTER — Ambulatory Visit (INDEPENDENT_AMBULATORY_CARE_PROVIDER_SITE_OTHER)
Admission: RE | Admit: 2016-04-06 | Discharge: 2016-04-06 | Disposition: A | Discharge status: Home / Self Care | Payer: BLUE CROSS/BLUE SHIELD | Source: Ambulatory Visit | Attending: Internal Medicine | Admitting: Internal Medicine

## 2016-04-06 ENCOUNTER — Encounter: Payer: Self-pay | Admitting: Internal Medicine

## 2016-04-06 ENCOUNTER — Ambulatory Visit (INDEPENDENT_AMBULATORY_CARE_PROVIDER_SITE_OTHER): Payer: BLUE CROSS/BLUE SHIELD | Admitting: Internal Medicine

## 2016-04-06 VITALS — BP 134/80 | HR 92 | Temp 98.8°F | Resp 18 | Wt 358.0 lb

## 2016-04-06 DIAGNOSIS — R1084 Generalized abdominal pain: Secondary | ICD-10-CM

## 2016-04-06 DIAGNOSIS — I10 Essential (primary) hypertension: Secondary | ICD-10-CM

## 2016-04-06 DIAGNOSIS — E1165 Type 2 diabetes mellitus with hyperglycemia: Secondary | ICD-10-CM

## 2016-04-06 LAB — CBC WITH DIFFERENTIAL/PLATELET
BASOS ABS: 0 10*3/uL (ref 0.0–0.1)
Basophils Relative: 0.3 % (ref 0.0–3.0)
Eosinophils Absolute: 0.2 10*3/uL (ref 0.0–0.7)
Eosinophils Relative: 2 % (ref 0.0–5.0)
HCT: 45.3 % (ref 39.0–52.0)
Hemoglobin: 15.5 g/dL (ref 13.0–17.0)
LYMPHS ABS: 2.1 10*3/uL (ref 0.7–4.0)
Lymphocytes Relative: 21.8 % (ref 12.0–46.0)
MCHC: 34.3 g/dL (ref 30.0–36.0)
MCV: 86.9 fl (ref 78.0–100.0)
MONO ABS: 0.6 10*3/uL (ref 0.1–1.0)
Monocytes Relative: 6.4 % (ref 3.0–12.0)
NEUTROS PCT: 69.5 % (ref 43.0–77.0)
Neutro Abs: 6.6 10*3/uL (ref 1.4–7.7)
Platelets: 215 10*3/uL (ref 150.0–400.0)
RBC: 5.21 Mil/uL (ref 4.22–5.81)
RDW: 13.4 % (ref 11.5–15.5)
WBC: 9.5 10*3/uL (ref 4.0–10.5)

## 2016-04-06 LAB — COMPREHENSIVE METABOLIC PANEL
ALK PHOS: 55 U/L (ref 39–117)
ALT: 41 U/L (ref 0–53)
AST: 28 U/L (ref 0–37)
Albumin: 4.2 g/dL (ref 3.5–5.2)
BILIRUBIN TOTAL: 0.7 mg/dL (ref 0.2–1.2)
BUN: 13 mg/dL (ref 6–23)
CO2: 32 mEq/L (ref 19–32)
Calcium: 8.9 mg/dL (ref 8.4–10.5)
Chloride: 99 mEq/L (ref 96–112)
Creatinine, Ser: 0.89 mg/dL (ref 0.40–1.50)
GFR: 96.82 mL/min (ref >60.00)
GLUCOSE: 152 mg/dL — AB (ref 70–99)
POTASSIUM: 4.1 meq/L (ref 3.5–5.1)
SODIUM: 136 meq/L (ref 135–145)
TOTAL PROTEIN: 7.2 g/dL (ref 6.0–8.3)

## 2016-04-06 LAB — HEMOGLOBIN A1C: Hgb A1c MFr Bld: 7.6 % — ABNORMAL HIGH (ref 4.6–6.5)

## 2016-04-06 MED ORDER — PROMETHAZINE HCL 25 MG PO TABS: 25.0000 mg | ORAL_TABLET | Freq: Three times a day (TID) | ORAL | 0 refills | Status: DC | PRN

## 2016-04-06 NOTE — Progress Notes (Signed)
Subjective:    Patient ID: Scott Avery, male    DOB: 05-18-1966, 50 y.o.   MRN: [id number]  HPI The patient is here for follow up.  Hypertension: He is taking his medication daily. He is compliant with a low sodium diet.  He denies chest pain, palpitations, edema, shortness of breath and regular headaches. He is more active.      Stomach pain, diarrhea, nausea:  It started three nights ago.  He has had multiple episodes of small bowel obstruction.  Three nights ago he had a salad for dinner.   He started having abdominal cramping and pain, non-bloody, diarrhea, nausea and vomiting later that night.  He has vomited a couple of times and it is has been food that he ate a couple of days prior.  Over the past couple of days his stomach pain and diarrhea has improved.  He is feeling better and thinks he can go back to work tomorrow.   Diabetes: He is trying to control his sugars with diet. He is compliant with a diabetic diet. He is more active than previously. He has not been monitoring his sugars regularly.  He checks his feet daily and denies foot lesions. He is up-to-date with an ophthalmology examination.   He has intermittent right groin pain and hip pain.  He has seen Dr Tamala Julian.     Medications and allergies reviewed with patient and updated if appropriate.  Patient Active Problem List   Diagnosis Date Noted  . Morbid obesity ([place]) 10/03/2015  . Fatty liver 10/03/2015  . SBO (small bowel obstruction) ([place]) 06/21/2014  . Lumbar radiculopathy 05/17/2014  . Microscopic hematuria 04/19/2014  . Plantar fasciitis of left foot 03/26/2014  . Diabetes mellitus with hyperglycemia ([place]) 03/23/2014  . Hypertriglyceridemia 03/23/2014  . Sleep apnea 07/31/2012  . Umbilical hernia [id number]  . GERD 07/22/2009  . Essential hypertension 08/23/2007    Current Outpatient Prescriptions on File Prior to Visit  Medication Sig Dispense Refill  . amLODipine (NORVASC) 5 MG tablet Take 1 tablet  (5 mg total) by mouth daily. 90 tablet 2  . ipratropium (ATROVENT) 0.03 % nasal spray Place 2 sprays into both nostrils 2 (two) times daily. (Patient taking differently: Place 2 sprays into both nostrils 2 (two) times daily as needed for rhinitis. ) 30 mL 0  . labetalol (NORMODYNE) 300 MG tablet Take 1 tablet (300 mg total) by mouth 2 (two) times daily. 180 tablet 2  . losartan (COZAAR) 100 MG tablet TAKE 1 TABLET (100 MG TOTAL) BY MOUTH DAILY. 30 tablet 5  . omeprazole (PRILOSEC) 20 MG capsule Take 20 mg by mouth daily.     No current facility-administered medications on file prior to visit.     Past Medical History:  Diagnosis Date  . Arthritis    "hands; right hip" (06/21/2014)  . Childhood asthma   . Gastroenteritis   . GERD (gastroesophageal reflux disease)   . Hypertension   . Hypertriglyceridemia   . Morbid obesity ([place])   . OSA on CPAP   . SBO (small bowel obstruction) ([place]) 05/2014  . Type II diabetes mellitus ([place]) dx'd ~ 03/2014    Past Surgical History:  Procedure Laterality Date  . HERNIA REPAIR    . LAPAROSCOPIC CHOLECYSTECTOMY  2005  . LAPAROSCOPIC INCISIONAL / UMBILICAL / VENTRAL HERNIA REPAIR  ~ 2007   2 yrs S/P chole  . LIPOMA EXCISION Left ~ 2010   [facility]  . WISDOM TOOTH EXTRACTION  Social History   Social History  . Marital status: Single    Spouse name: N/A  . Number of children: N/A  . Years of education: N/A   Social History Main Topics  . Smoking status: Former Smoker    Packs/day: 0.50    Years: 20.00    Types: Cigarettes    Quit date: 02/24/2015  . Smokeless tobacco: Never Used  . Alcohol use 0.0 oz/week     Comment: 06/21/2014 "last drink was in 2012"  . Drug use: No  . Sexual activity: Yes   Other Topics Concern  . Not on file   Social History Narrative  . No narrative on file    Family History  Problem Relation Age of Onset  . Heart disease Father     MI @ 63 in context of PNA  . Hypertension Father   . Leukemia Maternal  Grandfather   . Prostate cancer Paternal Grandfather   . Diabetes Paternal Grandfather   . Stroke Neg Hx     Review of Systems  Constitutional: Positive for appetite change (decreased). Negative for fever.  Respiratory: Negative for cough, shortness of breath and wheezing.   Cardiovascular: Negative for chest pain, palpitations and leg swelling.  Gastrointestinal: Positive for abdominal pain, diarrhea, nausea and vomiting. Negative for blood in stool.  Neurological: Negative for dizziness, light-headedness and headaches.       Objective:   Vitals:   04/06/16 1542  BP: 134/80  Pulse: 92  Resp: 18  Temp: 98.8 F (37.1 C)   Filed Weights   04/06/16 1542  Weight: (!) 358 lb (162.4 kg)   Body mass index is 48.55 kg/m.   Physical Exam    Constitutional: Appears well-developed and well-nourished. No distress.  HENT:  Head: Normocephalic and atraumatic.  Neck: Neck supple. No tracheal deviation present. No thyromegaly present.  Cardiovascular: Normal rate, regular rhythm and normal heart sounds.   No murmur heard. No carotid bruit  Pulmonary/Chest: Effort normal and breath sounds normal. No respiratory distress. No has no wheezes. No rales.  Abdomen: soft, obese, tender in upper abdomen w/o rebound or guarding Musculoskeletal: No edema.  Lymphadenopathy: No cervical adenopathy.  Skin: Skin is warm and dry. Not diaphoretic.  Psychiatric: Normal mood and affect. Behavior is normal.     Assessment & Plan:    See Problem List for Assessment and Plan of chronic medical problems.

## 2016-04-06 NOTE — Assessment & Plan Note (Addendum)
?   Partial obstruction, history of gastroenteritis and bowel obstruction Check xray Check cbc, cmp Promethazine 25 mg TID prn Symptoms improving so will hold off on ct scan  Feels he can return to work tomorrow - not given He will call or return if symptoms do not improve

## 2016-04-06 NOTE — Patient Instructions (Signed)
  Test(s) ordered today. Your results will be released to [facility] (or called to you) after review, usually within 72hours after test completion. If any changes need to be made, you will be notified at that same time.  No immunizations administered today.   Medications reviewed and updated.  Changes include an anti-nausea medication, promethazine.   Your prescription(s) have been submitted to your pharmacy. Please take as directed and contact our office if you believe you are having problem(s) with the medication(s).   Please followup in 6 months

## 2016-04-06 NOTE — Assessment & Plan Note (Signed)
BP well controlled Current regimen effective and well tolerated Continue current medications at current doses cmp  

## 2016-04-06 NOTE — Assessment & Plan Note (Addendum)
Diet controlled Stressed weight loss Dec portions, continue increased activity a1c today

## 2016-04-06 NOTE — Progress Notes (Signed)
Pre visit review using our clinic review tool, if applicable. No additional management support is needed unless otherwise documented below in the visit note. 

## 2016-04-07 ENCOUNTER — Encounter: Payer: Self-pay | Admitting: Internal Medicine

## 2016-04-27 ENCOUNTER — Ambulatory Visit: Payer: BLUE CROSS/BLUE SHIELD | Admitting: Family Medicine

## 2016-04-27 NOTE — Telephone Encounter (Signed)
Pt states he is still having stomach bloating, diarrhea and vomiting. Informing him on both x-ray and lab results. Please advise if there is anything he can do.

## 2016-04-27 NOTE — Progress Notes (Deleted)
[facility] [address] [place], [place] [address] Phone: [phone] Subjective:    CC: Back and hip pain  [id number]  Scott Avery is a 50 y.o. male coming in with complaint of Patient was having significant decreased range of motion of the right hip greater than 2 years ago. Patient was seen by me and had x-rays at that time. 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 Patient states  Past Medical History:  Diagnosis Date  . Arthritis    "hands; right hip" (06/21/2014)  . Childhood asthma   . Gastroenteritis   . GERD (gastroesophageal reflux disease)   . Hypertension   . Hypertriglyceridemia   . Morbid obesity ([facility])   . OSA on CPAP   . SBO (small bowel obstruction) 05/2014  . Type II diabetes mellitus ([facility]) dx'd ~ 03/2014   Past Surgical History:  Procedure Laterality Date  . HERNIA REPAIR    . LAPAROSCOPIC CHOLECYSTECTOMY  2005  . LAPAROSCOPIC INCISIONAL / UMBILICAL / VENTRAL HERNIA REPAIR  ~ 2007   2 yrs S/P chole  . LIPOMA EXCISION Left ~ 2010   [facility]  . WISDOM TOOTH EXTRACTION     Social History  Substance Use Topics  . Smoking status: Former Smoker    Packs/day: 0.50    Years: 20.00    Types: Cigarettes    Quit date: 02/24/2015  . Smokeless tobacco: Never Used  . Alcohol use 0.0 oz/week     Comment: 06/21/2014 "last drink was in 2012"   No Known Allergies Family History  Problem Relation Age of Onset  . Heart disease Father     MI @ 47 in context of PNA  . Hypertension Father   . Leukemia Maternal Grandfather   . Prostate cancer Paternal Grandfather   . Diabetes Paternal Grandfather   . Stroke Neg Hx       Past medical history, social, surgical and family history all reviewed in electronic medical record.   Review of Systems: No headache, visual changes, nausea, vomiting, diarrhea, constipation, dizziness, abdominal pain, skin  rash, fevers, chills, night sweats, weight loss, swollen lymph nodes, body aches, joint swelling, muscle aches, chest pain, shortness of breath, mood changes.   Objective  There were no vitals taken for this visit.  General: No apparent distress alert and oriented x3 mood and affect normal, dressed appropriately. Severely obese HEENT: Pupils equal, extraocular movements intact  Respiratory: Patient's speak in full sentences and does not appear short of breath  Cardiovascular: No lower extremity edema, non tender, no erythema  Skin: Warm dry intact with no signs of infection or rash on extremities or on axial skeleton.  Abdomen: Soft nontender  Neuro: Cranial nerves II through XII are intact, neurovascularly intact in all extremities with 2+ DTRs and 2+ pulses.  Lymph: No lymphadenopathy of posterior or anterior cervical chain or axillae bilaterally.  Gait normal with good balance and coordination.  MSK:  Non tender with full range of motion and good stability and symmetric strength and tone of shoulders, elbows, wrist, hip, knee and ankles bilaterally.  Normal inspection with no visable or palpable fat pad atrophy and no visible swelling/erythema. Patient a longer tender to palpation over the medial calcaneal region. Great toe motion: Full range of motion Arch shape: Mild breakdown of the longitudinal and transverse arch bilaterally.  Back Exam:  Inspection: Unremarkable  Motion: Flexion 35 deg,  Extension 35 deg, Side Bending to 35 deg bilaterally,  Rotation to 45 deg bilaterally  SLR laying: Right side XSLR laying: Negative  Palpable tenderness: Mild tenderness over the paraspinal musculature especially L5-S1 on the right side. FABER: negative. +++ FADIR Sensory change: Gross sensation intact to all lumbar and sacral dermatomes.  Reflexes: 2+ at both patellar tendons, 2+ at achilles tendons, Babinski's downgoing.  Strength at foot  Plantar-flexion: 5/5 Dorsi-flexion: 5/5 Eversion: 5/5  Inversion: 5/5  Leg strength  Quad: 5/5 Hamstring: 4/5 Hip flexor: 4/5 Hip abductors: 3+/5  Gait unremarkable.   Impression and Recommendations:     This case required medical decision making of moderate complexity.

## 2016-04-27 NOTE — Telephone Encounter (Signed)
Patient would like the nurse to call him about the results of the scan. He states he has some questions. Please follow up with patient. Thank you.

## 2016-04-29 NOTE — Telephone Encounter (Signed)
The xray is limited in what it can show.  If he is still having symptoms he needs further evaluation.  He may need a ct scan -if which case he needs to be seen again.  I can also refer him to GI which may take a little longer.  It may be best for him to come in to see me soon so we can consider getting a ct scan.

## 2016-05-04 NOTE — Telephone Encounter (Signed)
Spoke with pt, pt states that he has cut back on hot food, friend foods, and sugar. He feels that this has helped a lot. He is having normal BMs. Would like to have the merformin sent in. Please send to [facility].

## 2016-05-06 MED ORDER — METFORMIN HCL 500 MG PO TABS: 500.0000 mg | ORAL_TABLET | Freq: Two times a day (BID) | ORAL | 3 refills | Status: DC

## 2016-05-29 ENCOUNTER — Other Ambulatory Visit: Payer: Self-pay | Admitting: Internal Medicine

## 2016-07-09 ENCOUNTER — Telehealth: Payer: Self-pay | Admitting: Internal Medicine

## 2016-07-09 NOTE — Telephone Encounter (Signed)
Patient called asking for an injection in his r hip for r hip pain. No availability. Are you guys able to fit him in? Last seen 2015

## 2016-07-09 NOTE — Telephone Encounter (Signed)
I am sorry we will need to see if we get an opening.

## 2016-07-27 ENCOUNTER — Other Ambulatory Visit: Payer: Self-pay | Admitting: Internal Medicine

## 2016-07-31 NOTE — Progress Notes (Deleted)
[name] [address], [place] [address] Phone: [phone] Subjective:    I'm seeing this patient by the request  of:  Binnie Rail, MD   CC: right hip pain  [id number]  Scott Avery is a 51 y.o. male coming in with complaint of right hip pain.   patient's last x-rays of right hip for October 2015. These were independently visualized by me showing mild arthritic changes of the right hip.  Past Medical History:  Diagnosis Date  . Arthritis    "hands; right hip" (06/21/2014)  . Childhood asthma   . Gastroenteritis   . GERD (gastroesophageal reflux disease)   . Hypertension   . Hypertriglyceridemia   . Morbid obesity ([facility])   . OSA on CPAP   . SBO (small bowel obstruction) 05/2014  . Type II diabetes mellitus ([facility]) dx'd ~ 03/2014   Past Surgical History:  Procedure Laterality Date  . HERNIA REPAIR    . LAPAROSCOPIC CHOLECYSTECTOMY  2005  . LAPAROSCOPIC INCISIONAL / UMBILICAL / VENTRAL HERNIA REPAIR  ~ 2007   2 yrs S/P chole  . LIPOMA EXCISION Left ~ 2010   [facility]  . WISDOM TOOTH EXTRACTION     Social History   Social History  . Marital status: Single    Spouse name: N/A  . Number of children: N/A  . Years of education: N/A   Social History Main Topics  . Smoking status: Former Smoker    Packs/day: 0.50    Years: 20.00    Types: Cigarettes    Quit date: 02/24/2015  . Smokeless tobacco: Never Used  . Alcohol use 0.0 oz/week     Comment: 06/21/2014 "last drink was in 2012"  . Drug use: No  . Sexual activity: Yes   Other Topics Concern  . Not on file   Social History Narrative  . No narrative on file   No Known Allergies Family History  Problem Relation Age of Onset  . Heart disease Father     MI @ 35 in context of PNA  . Hypertension Father   . Leukemia Maternal Grandfather   . Prostate cancer Paternal Grandfather   . Diabetes Paternal Grandfather   . Stroke Neg Hx     Past medical history, social,  surgical and family history all reviewed in electronic medical record.  No pertanent information unless stated regarding to the chief complaint.   Review of Systems:Review of systems updated and as accurate as of 07/31/16  No headache, visual changes, nausea, vomiting, diarrhea, constipation, dizziness, abdominal pain, skin rash, fevers, chills, night sweats, weight loss, swollen lymph nodes, body aches, joint swelling, muscle aches, chest pain, shortness of breath, mood changes.   Objective  There were no vitals taken for this visit. Systems examined below as of 07/31/16   General: No apparent distress alert and oriented x3 mood and affect normal, dressed appropriately.  HEENT: Pupils equal, extraocular movements intact  Respiratory: Patient's speak in full sentences and does not appear short of breath  Cardiovascular: No lower extremity edema, non tender, no erythema  Skin: Warm dry intact with no signs of infection or rash on extremities or on axial skeleton.  Abdomen: Soft nontender  Neuro: Cranial nerves II through XII are intact, neurovascularly intact in all extremities with 2+ DTRs and 2+ pulses.  Lymph: No lymphadenopathy of posterior or anterior cervical chain or axillae bilaterally.  Gait normal with good balance and coordination.  MSK:  Non tender  with full range of motion and good stability and symmetric strength and tone of shoulders, elbows, wrist, , knee and ankles bilaterally.  Hip: ROM IR: 45 Deg, ER: 45 Deg, Flexion: 120 Deg, Extension: 100 Deg, Abduction: 45 Deg, Adduction: 45 Deg Strength IR: 5/5, ER: 5/5, Flexion: 5/5, Extension: 5/5, Abduction: 5/5, Adduction: 5/5 Pelvic alignment unremarkable to inspection and palpation. Standing hip rotation and gait without trendelenburg sign / unsteadiness. Greater trochanter without tenderness to palpation. No tenderness over piriformis and greater trochanter. No pain with FABER or FADIR. No SI joint tenderness and normal  minimal SI movement.   Impression and Recommendations:     This case required medical decision making of moderate complexity.      Note: This dictation was prepared with Dragon dictation along with smaller phrase technology. Any transcriptional errors that result from this process are unintentional.

## 2016-08-02 ENCOUNTER — Ambulatory Visit: Payer: BLUE CROSS/BLUE SHIELD | Admitting: Family Medicine

## 2016-08-13 NOTE — Progress Notes (Signed)
[name] [address], [place] [address] Phone: [phone] Subjective:    I'm seeing this patient by the request  of:  Binnie Rail, MD   CC: Bilateral hip pain   [id number]  Scott Avery is a 51 y.o. male coming in with complaint of bilateral hip pain. Patient was seen by me and greater than 3 years ago. At that point patient was having what seemed to be more like a lumbar radiculopathy. Patient did have a grade 2 spondylolisthesis in these x-rays were once again independently visualized by me. Patient states Pain has been worsening considerably. More pain in the right groin area. Some mild pain on the lateral aspect the leg but seems to be more of a radiating. Worse at night. Worse with activity such as standing for long amount of time as well. Patient does do a lot of manual labor. States that this is mostly on the right side and very minimal pain on the left side at this time.   patient did have x-rays of the right hip taken 05/17/2014. Patient was shown to have some mild arthritic changes of the right hip but otherwise unremarkable. Patient did though have degenerative disc disease lumbar spine as well as degenerative changes of the sacroiliac joint bilaterally. 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  Past Medical History:  Diagnosis Date  . Arthritis    "hands; right hip" (06/21/2014)  . Childhood asthma   . Gastroenteritis   . GERD (gastroesophageal reflux disease)   . Hypertension   . Hypertriglyceridemia   . Morbid obesity ([facility])   . OSA on CPAP   . SBO (small bowel obstruction) 05/2014  . Type II diabetes mellitus ([facility]) dx'd ~ 03/2014   Past Surgical History:  Procedure Laterality Date  . HERNIA REPAIR    . LAPAROSCOPIC CHOLECYSTECTOMY  2005  . LAPAROSCOPIC INCISIONAL / UMBILICAL / VENTRAL HERNIA REPAIR  ~ 2007   2 yrs S/P chole  . LIPOMA EXCISION Left ~ 2010   orearm  . WISDOM TOOTH EXTRACTION     Social History    Social History  . Marital status: Single    Spouse name: N/A  . Number of children: N/A  . Years of education: N/A   Social History Main Topics  . Smoking status: Former Smoker    Packs/day: 0.50    Years: 20.00    Types: Cigarettes    Quit date: 02/24/2015  . Smokeless tobacco: Never Used  . Alcohol use 0.0 oz/week     Comment: 06/21/2014 "last drink was in 2012"  . Drug use: No  . Sexual activity: Yes   Other Topics Concern  . None   Social History Narrative  . None   No Known Allergies Family History  Problem Relation Age of Onset  . Heart disease Father     MI @ 107 in context of PNA  . Hypertension Father   . Leukemia Maternal Grandfather   . Prostate cancer Paternal Grandfather   . Diabetes Paternal Grandfather   . Stroke Neg Hx     Past medical history, social, surgical and family history all reviewed in electronic medical record.  No pertanent information unless stated regarding to the chief complaint.   Review of Systems: No headache, visual changes, nausea, vomiting, diarrhea, constipation, dizziness, abdominal pain, skin rash, fevers, chills, night sweats, weight loss, swollen lymph nodes, body aches, joint swelling, muscle aches, chest pain, shortness of breath, mood  changes.    Objective  Blood pressure (!) 144/90, pulse 80, height 6' (1.829 m), weight (!) 366 lb (166 kg), SpO2 97 %. Systems examined below as of 08/16/16   General: No apparent distress alert and oriented x3 mood and affect normal, dressed appropriately.  Obese HEENT: Pupils equal, extraocular movements intact  Respiratory: Patient's speak in full sentences and does not appear short of breath  Cardiovascular: No lower extremity edema, non tender, no erythema  Skin: Warm dry intact with no signs of infection or rash on extremities or on axial skeleton.  Abdomen: Soft nontender  Neuro: Cranial nerves II through XII are intact, neurovascularly intact in all extremities with 2+ DTRs and 2+  pulses.  Lymph: No lymphadenopathy of posterior or anterior cervical chain or axillae bilaterally.  Gait Antalgic gait.  MSK:  Non tender with full range of motion and good stability and symmetric strength and tone of shoulders, elbows, wrist, knee and ankles bilaterally.  Hip: Right ROM IR: 5 Deg with worsening pain., ER: 45 Deg, Flexion: 120 Deg, Extension: 100 Deg, Abduction: 45 Deg, Adduction: 25 Deg Strength IR: 5/5, ER: 5/5, Flexion: 5/5, Extension: 5/5, Abduction: 5/5, Adduction: 5/5 Pelvic alignment unremarkable to inspection and palpation. Standing hip rotation and gait without trendelenburg sign / unsteadiness. Greater trochanter without tenderness to palpation. Severe pain with internal rotation No SI joint tenderness and normal minimal SI movement. Contralateral hip fairly unremarkable.  Back exam shows the patient has very minimal range of motion in all planes. Mild tenderness in the paraspinal musculature of the lumbar spine. Significant tightness of the hamstrings bilaterally but negative radicular symptoms straight leg test. Significant tightness with Corky Sox test bilaterally.  Procedure note [id number]; 15 minutes spent for Therapeutic exercises as stated in above notes.  This included exercises focusing on stretching, strengthening, with significant focus on eccentric aspects. Hip strengthening exercises which included:  Pelvic tilt/bracing to help with proper recruitment of the lower abs and pelvic floor muscles  Glute strengthening to properly contract glutes without over-engaging low back and hamstrings - prone hip extension and glute bridge exercises Proper stretching techniques to increase effectiveness for the hip flexors, groin, quads, piriformic and low back when appropriate     Proper technique shown and discussed handout in great detail with ATC.  All questions were discussed and answered.     Impression and Recommendations:     This case required medical decision  making of moderate complexity.      Note: This dictation was prepared with Dragon dictation along with smaller phrase technology. Any transcriptional errors that result from this process are unintentional.

## 2016-08-16 ENCOUNTER — Ambulatory Visit (INDEPENDENT_AMBULATORY_CARE_PROVIDER_SITE_OTHER)
Admission: RE | Admit: 2016-08-16 | Discharge: 2016-08-16 | Disposition: A | Discharge status: Home / Self Care | Payer: BLUE CROSS/BLUE SHIELD | Source: Ambulatory Visit | Attending: Family Medicine | Admitting: Family Medicine

## 2016-08-16 ENCOUNTER — Ambulatory Visit (INDEPENDENT_AMBULATORY_CARE_PROVIDER_SITE_OTHER): Payer: BLUE CROSS/BLUE SHIELD | Admitting: Family Medicine

## 2016-08-16 ENCOUNTER — Encounter: Payer: Self-pay | Admitting: Family Medicine

## 2016-08-16 VITALS — BP 144/90 | HR 80 | Ht 72.0 in | Wt 366.0 lb

## 2016-08-16 DIAGNOSIS — M25551 Pain in right hip: Secondary | ICD-10-CM | POA: Insufficient documentation

## 2016-08-16 DIAGNOSIS — M5416 Radiculopathy, lumbar region: Secondary | ICD-10-CM | POA: Diagnosis not present

## 2016-08-16 MED ORDER — VITAMIN D (ERGOCALCIFEROL) 1.25 MG (50000 UNIT) PO CAPS: 50000.0000 [IU] | ORAL_CAPSULE | ORAL | 0 refills | Status: DC

## 2016-08-16 MED ORDER — GABAPENTIN 100 MG PO CAPS: 200.0000 mg | ORAL_CAPSULE | Freq: Every day | ORAL | 3 refills | Status: DC

## 2016-08-16 NOTE — Patient Instructions (Addendum)
Good to see you again.  Sorry for the bad news.  Ice 20 minutes 2 times daily. Usually after activity and before bed. Exercises 3 times a week.  Once weekly vitamin D for next 12 weeks.  New xray of right hip Gabapentin 200mg  at night  See me again in 3 weeks and if still in pain we will inject the hip and see how it does.

## 2016-08-16 NOTE — Assessment & Plan Note (Signed)
Severe decreasing range of motion. Worsening pain with internal rotation. Patient does walk with an external rotated leg and affect gait. I'm concerned with more of a hip arthritis. X-rays 3 years ago did show patient having mild arthritic changes. Patient does have a spina bifida occulta could also be contribute to some of the discomfort and pain and more radicular symptoms. Started on gabapentin. We discussed icing regimen. Patient will come back immediately to 3 weeks. Pinning of the amount of arthritis we'll consider injection intra-articular for diagnostic and potentially therapeutic purposes.

## 2016-08-18 ENCOUNTER — Telehealth: Payer: Self-pay

## 2016-08-18 NOTE — Telephone Encounter (Signed)
Spoke with patient regarding x-ray results per Dr. Tamala Julian.

## 2016-08-18 NOTE — Telephone Encounter (Signed)
-----   Message from Lyndal Pulley, DO sent at 08/17/2016 12:41 PM EST ----- If you can get some time and call patient He does have now severe arthritis of the hip.  We can either try injection at next visit or I can refer him for possible replacement if he asks.

## 2016-09-05 NOTE — Progress Notes (Deleted)
[name] [address] [place], [place] [address] Phone: [phone] Subjective:    I'm seeing this patient by the request  of:  Binnie Rail, MD   CC: Bilateral hip pain f/u  [id number]  Scott Avery is a 51 y.o. male coming in with complaint of bilateral hip pain. Patient did have a grade 2 spondylolisthesis in these x-rays were once again independently visualized by me. Patient was having more pain and seems to be localized around the right hip. X-rays did show severe arthritis. Patient was to start once weekly vitamin D and gabapentin. Patient was to do home exercises. Patient states   patient also had x-rays of the hip right side taken 08/16/2016. This was independently visualized by me. I have severe arthritis of the hip.. Patient did though have degenerative disc disease lumbar spine as well as degenerative changes of the sacroiliac joint bilaterally. 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  Past Medical History:  Diagnosis Date  . Arthritis    "hands; right hip" (06/21/2014)  . Childhood asthma   . Gastroenteritis   . GERD (gastroesophageal reflux disease)   . Hypertension   . Hypertriglyceridemia   . Morbid obesity ([facility])   . OSA on CPAP   . SBO (small bowel obstruction) 05/2014  . Type II diabetes mellitus ([facility]) dx'd ~ 03/2014   Past Surgical History:  Procedure Laterality Date  . HERNIA REPAIR    . LAPAROSCOPIC CHOLECYSTECTOMY  2005  . LAPAROSCOPIC INCISIONAL / UMBILICAL / VENTRAL HERNIA REPAIR  ~ 2007   2 yrs S/P chole  . LIPOMA EXCISION Left ~ 2010   orearm  . WISDOM TOOTH EXTRACTION     Social History   Social History  . Marital status: Single    Spouse name: N/A  . Number of children: N/A  . Years of education: N/A   Social History Main Topics  . Smoking status: Former Smoker    Packs/day: 0.50    Years: 20.00    Types: Cigarettes    Quit date: 02/24/2015  . Smokeless tobacco: Never Used  . Alcohol use 0.0  oz/week     Comment: 06/21/2014 "last drink was in 2012"  . Drug use: No  . Sexual activity: Yes   Other Topics Concern  . Not on file   Social History Narrative  . No narrative on file   No Known Allergies Family History  Problem Relation Age of Onset  . Heart disease Father     MI @ 32 in context of PNA  . Hypertension Father   . Leukemia Maternal Grandfather   . Prostate cancer Paternal Grandfather   . Diabetes Paternal Grandfather   . Stroke Neg Hx     Past medical history, social, surgical and family history all reviewed in electronic medical record.  No pertanent information unless stated regarding to the chief complaint.   Review of Systems: No headache, visual changes, nausea, vomiting, diarrhea, constipation, dizziness, abdominal pain, skin rash, fevers, chills, night sweats, weight loss, swollen lymph nodes, body aches, joint swelling, muscle aches, chest pain, shortness of breath, mood changes.    Objective  There were no vitals taken for this visit. Systems examined below as of 09/05/16   General: No apparent distress alert and oriented x3 mood and affect normal, dressed appropriately.  Obese HEENT: Pupils equal, extraocular movements intact  Respiratory: Patient's speak in full sentences and does not appear short of breath  Cardiovascular: No  lower extremity edema, non tender, no erythema  Skin: Warm dry intact with no signs of infection or rash on extremities or on axial skeleton.  Abdomen: Soft nontender  Neuro: Cranial nerves II through XII are intact, neurovascularly intact in all extremities with 2+ DTRs and 2+ pulses.  Lymph: No lymphadenopathy of posterior or anterior cervical chain or axillae bilaterally.  Gait Antalgic gait.  MSK:  Non tender with full range of motion and good stability and symmetric strength and tone of shoulders, elbows, wrist, knee and ankles bilaterally.  Hip: Right ROM IR: 5 Deg with worsening pain., ER: 45 Deg, Flexion: 120 Deg,  Extension: 100 Deg, Abduction: 45 Deg, Adduction: 25 Deg Strength IR: 5/5, ER: 5/5, Flexion: 5/5, Extension: 5/5, Abduction: 5/5, Adduction: 5/5 Pelvic alignment unremarkable to inspection and palpation. Standing hip rotation and gait without trendelenburg sign / unsteadiness. Greater trochanter without tenderness to palpation. Severe pain with internal rotation No SI joint tenderness and normal minimal SI movement. Contralateral hip fairly unremarkable.  Back exam shows the patient has very minimal range of motion in all planes. Mild tenderness in the paraspinal musculature of the lumbar spine. Significant tightness of the hamstrings bilaterally but negative radicular symptoms straight leg test. Significant tightness with Corky Sox test bilaterally.     Impression and Recommendations:     This case required medical decision making of moderate complexity.      Note: This dictation was prepared with Dragon dictation along with smaller phrase technology. Any transcriptional errors that result from this process are unintentional.

## 2016-09-06 ENCOUNTER — Ambulatory Visit: Payer: BLUE CROSS/BLUE SHIELD | Admitting: Family Medicine

## 2016-09-12 NOTE — Progress Notes (Signed)
[name] [address] [place], [place] [address] Phone: [phone] Subjective:    I'm seeing this patient by the request  of:  Binnie Rail, MD   CC: Bilateral hip pain f/u  [id number]  Scott Avery is a 51 y.o. male coming in with complaint of bilateral hip pain. Patient did have a grade 2 spondylolisthesis in these x-rays were once again independently visualized by me. Patient was having more pain and seems to be localized around the right hip. X-rays did show severe arthritis. Patient was to start once weekly vitamin D and gabapentin. Patient was to do home exercises. Patient states Minimal improvement. Still seems to be unfortunately affecting daily activities. Patient is had difficulty overall. Patient states that even walking has become difficult. Patient also has difficulty going up   patient also had x-rays of the hip right side taken 08/16/2016. This was independently visualized by me. I have severe arthritis of the hip.. Patient did though have degenerative disc disease lumbar spine as well as degenerative changes of the sacroiliac joint bilaterally. 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  Past Medical History:  Diagnosis Date  . Arthritis    "hands; right hip" (06/21/2014)  . Childhood asthma   . Gastroenteritis   . GERD (gastroesophageal reflux disease)   . Hypertension   . Hypertriglyceridemia   . Morbid obesity ([facility])   . OSA on CPAP   . SBO (small bowel obstruction) 05/2014  . Type II diabetes mellitus ([facility]) dx'd ~ 03/2014   Past Surgical History:  Procedure Laterality Date  . HERNIA REPAIR    . LAPAROSCOPIC CHOLECYSTECTOMY  2005  . LAPAROSCOPIC INCISIONAL / UMBILICAL / VENTRAL HERNIA REPAIR  ~ 2007   2 yrs S/P chole  . LIPOMA EXCISION Left ~ 2010   orearm  . WISDOM TOOTH EXTRACTION     Social History   Social History  . Marital status: Single    Spouse name: N/A  . Number of children: N/A  . Years of education:  N/A   Social History Main Topics  . Smoking status: Former Smoker    Packs/day: 0.50    Years: 20.00    Types: Cigarettes    Quit date: 02/24/2015  . Smokeless tobacco: Never Used  . Alcohol use 0.0 oz/week     Comment: 06/21/2014 "last drink was in 2012"  . Drug use: No  . Sexual activity: Yes   Other Topics Concern  . None   Social History Narrative  . None   No Known Allergies Family History  Problem Relation Age of Onset  . Heart disease Father     MI @ 72 in context of PNA  . Hypertension Father   . Leukemia Maternal Grandfather   . Prostate cancer Paternal Grandfather   . Diabetes Paternal Grandfather   . Stroke Neg Hx     Past medical history, social, surgical and family history all reviewed in electronic medical record.  No pertanent information unless stated regarding to the chief complaint.   Review of Systems: No headache, visual changes, nausea, vomiting, diarrhea, constipation, dizziness, abdominal pain, skin rash, fevers, chills, night sweats, weight loss, swollen lymph nodes,, chest pain, shortness of breath, mood changes.     Objective  Blood pressure (!) 158/92, pulse 66, height 5\' 9"  (1.753 m), weight (!) 364 lb (165.1 kg). Systems examined below as of 09/13/16   Systems examined below as of 09/13/16 General: NAD A&O x3 mood,  affect normal  HEENT: Pupils equal, extraocular movements intact no nystagmus Respiratory: not short of breath at rest or with speaking Cardiovascular: No lower extremity edema, non tender Skin: Warm dry intact with no signs of infection or rash on extremities or on axial skeleton. Abdomen: Soft nontender, no masses Neuro: Cranial nerves  intact, neurovascularly intact in all extremities with 2+ DTRs and 2+ pulses. Lymph: No lymphadenopathy appreciated today  Gait Severely antalgic gait MSK: Non tender with full range of motion and good stability and symmetric strength and tone of shoulders, elbows, wrist,  knee and ankles  bilaterally.   Hip: Right ROM IR: 5 Deg with worsening pain. Even more so then previous exam.  ER: 35 Deg, Flexion: 120 Deg, Extension: 100 Deg, Abduction: 45 Deg, Adduction: 25 Deg Strength IR: 5/5, ER: 5/5, Flexion: 5/5, Extension: 5/5, Abduction: 5/5, Adduction: 5/5 Greater trochanter without tenderness to palpation. Increasing pain with internal rotation. No SI joint tenderness and normal minimal SI movement. Contralateral hip fairly unremarkable.  MSK US performed of: Right hip This study was ordered, performed, and interpreted by Charlann Boxer D.O.  Hip: The severity of the hip makes it difficult to even seen Dean femoral neck. Patient does have significant hypoechoic changes and does have a small effusion. Minimal joint space noted.  IMPRESSION:  Severe osteophytic changes of the right    Impression and Recommendations:     This case required medical decision making of moderate complexity.      Note: This dictation was prepared with Dragon dictation along with smaller phrase technology. Any transcriptional errors that result from this process are unintentional.

## 2016-09-13 ENCOUNTER — Encounter: Payer: Self-pay | Admitting: *Deleted

## 2016-09-13 ENCOUNTER — Ambulatory Visit: Payer: Self-pay

## 2016-09-13 ENCOUNTER — Ambulatory Visit (INDEPENDENT_AMBULATORY_CARE_PROVIDER_SITE_OTHER): Payer: BLUE CROSS/BLUE SHIELD | Admitting: Family Medicine

## 2016-09-13 ENCOUNTER — Encounter: Payer: Self-pay | Admitting: Family Medicine

## 2016-09-13 VITALS — BP 158/92 | HR 66 | Ht 69.0 in | Wt 364.0 lb

## 2016-09-13 DIAGNOSIS — M25551 Pain in right hip: Secondary | ICD-10-CM

## 2016-09-13 DIAGNOSIS — M1611 Unilateral primary osteoarthritis, right hip: Secondary | ICD-10-CM | POA: Diagnosis not present

## 2016-09-13 MED ORDER — MELOXICAM 15 MG PO TABS: 15.0000 mg | ORAL_TABLET | Freq: Every day | ORAL | 0 refills | Status: DC

## 2016-09-13 MED ORDER — TRAMADOL HCL 50 MG PO TABS: 50.0000 mg | ORAL_TABLET | Freq: Three times a day (TID) | ORAL | 0 refills | Status: DC | PRN

## 2016-09-13 NOTE — Assessment & Plan Note (Signed)
Severe arthritis. Discussed with patient again at great length. We were going to attempt an injection but unable to do it safely in the office. We discussed the possibility of doing natural fluoroscopy. Still be temporary. Patient does need to have placement. Patient denies any numbness or tingling. Denies any radiation down the leg. Patient states it is affecting daily activities. Patient will stop doing any lifting greater than 50 pounds. We will follow-up with a orthopedic surgeon. Given exercises otherwise. F/u prn

## 2016-09-13 NOTE — Patient Instructions (Addendum)
Good to see you.  Continue the other medicines Meloxicam daily  Tramadol up to 2 times a day but try it at home first  Dr. Rush Farmer would be good Call or write if you have questions

## 2016-09-29 ENCOUNTER — Other Ambulatory Visit: Payer: Self-pay | Admitting: *Deleted

## 2016-09-29 DIAGNOSIS — M1611 Unilateral primary osteoarthritis, right hip: Secondary | ICD-10-CM

## 2016-10-13 ENCOUNTER — Ambulatory Visit (INDEPENDENT_AMBULATORY_CARE_PROVIDER_SITE_OTHER): Payer: BLUE CROSS/BLUE SHIELD | Admitting: Orthopaedic Surgery

## 2016-10-13 DIAGNOSIS — M25551 Pain in right hip: Secondary | ICD-10-CM

## 2016-10-13 DIAGNOSIS — M1611 Unilateral primary osteoarthritis, right hip: Secondary | ICD-10-CM | POA: Diagnosis not present

## 2016-10-13 NOTE — Progress Notes (Signed)
Office Visit Note   Patient: Scott Avery           Date of Birth: 06-03-1966           MRN: [id number] Visit Date: 10/13/2016              Requested by: Lyndal Pulley, DO [address], [place] [address] PCP: Binnie Rail, MD   Assessment & Plan: Visit Diagnoses:  1. Pain of right hip joint   2. Unilateral primary osteoarthritis, right hip     Plan: I have recommended an anterior hip replacement surgery for him. I showed him his x-rays in the hip modeling spent in detail with the surgery involves including a thorough discussion of the risk and benefits of surgery. I do feel he is tried and failed all forms conservative treatment. His pain is been worsening over 3 years now. In spite of his obesity he is deathly good candidate for the surgery. I would see him back in 2 weeks postoperative but no x-rays of be needed.  Follow-Up Instructions: Return for 2 weeks post-op.   Orders:  No orders of the defined types were placed in this encounter.  No orders of the defined types were placed in this encounter.     Procedures: No procedures performed   Clinical Data: No additional findings.   Subjective: No chief complaint on file. The patient is a very pleasant 51 year old gentleman who comes in with chief complaint of right hip pain. He's been getting worse over 3 years now. His pain is 10 out of 10. It is detrimentally affected his activities daily living, his quality of life, and his mobility. The it is affecting his work as well. He is tried and failed all forms conservative treatment measures. He has x-rays that accompany him showing severe arthritis of his right hip. He has difficulty getting his shoes on and off and crossing his leg. He walks with a limp. He is someone who is morbidly obese and is working on activity modification and attempts at weight loss but his pain is severe. He has been unable to have an intra-articular steroid injection in his right  hip due to the severity of his arthritis. He has limited ability to take anti-inflammatories for a long period time due to chronic small bowel issues he was sent to me to evaluate him for hip replacement. His hip pain is affecting his groin on the right side. His affecting his knee on the right side as well as his low back.  HPI  Review of Systems He denies any current illnesses today. He denies any fever, chills, nausea, vomiting, headache, shortness breath, chest pain  Objective: Vital Signs: There were no vitals taken for this visit.  Physical Exam He is a moderately obese individual who is alert and oriented 3 in no acute distress Ortho Exam On examination he has fluid range of motion of his left hip. His right hip has essentially almost no internal or external rotation due to the severity of his pain. His leg lengths are near equal. His right hip hurts quite a significant amount in the groin area when I try to put him through range of motion. His knee exam is normal. He does have a little bit of low back pain. He has a significant amount truncal obesity. When I laid in supine position though I can easily mobilizes abdomen and his actual thigh muscles are not large and it looks like  he has a straightforward soft tissue plane to get to his right hip. Specialty Comments:  No specialty comments available.  Imaging: No results found. X-rays on canopy system and the patellae reviewed by me show significant end-stage arthritis of his right hip. There is complete loss of superior lateral joint space of the hip. This particular osteophytes. There is cystic changes on the acetabular side. They're sclerotic changes as well.  PMFS History: Patient Active Problem List   Diagnosis Date Noted  . Unilateral primary osteoarthritis, right hip 10/13/2016  . Arthritis of right hip 09/13/2016  . Right hip pain 08/16/2016  . Generalized abdominal pain 04/06/2016  . Morbid obesity ([facility]) 10/03/2015  .  Fatty liver 10/03/2015  . SBO (small bowel obstruction) ([facility]) 06/21/2014  . Lumbar radiculopathy 05/17/2014  . Microscopic hematuria 04/19/2014  . Plantar fasciitis of left foot 03/26/2014  . Diabetes mellitus with hyperglycemia ([facility]) 03/23/2014  . Hypertriglyceridemia 03/23/2014  . Sleep apnea 07/31/2012  . Umbilical hernia 25/42/7062  . GERD 07/22/2009  . Essential hypertension 08/23/2007   Past Medical History:  Diagnosis Date  . Arthritis    "hands; right hip" (06/21/2014)  . Childhood asthma   . Gastroenteritis   . GERD (gastroesophageal reflux disease)   . Hypertension   . Hypertriglyceridemia   . Morbid obesity ([facility])   . OSA on CPAP   . SBO (small bowel obstruction) 05/2014  . Type II diabetes mellitus ([facility]) dx'd ~ 03/2014    Family History  Problem Relation Age of Onset  . Heart disease Father     MI @ 60 in context of PNA  . Hypertension Father   . Leukemia Maternal Grandfather   . Prostate cancer Paternal Grandfather   . Diabetes Paternal Grandfather   . Stroke Neg Hx     Past Surgical History:  Procedure Laterality Date  . HERNIA REPAIR    . LAPAROSCOPIC CHOLECYSTECTOMY  2005  . LAPAROSCOPIC INCISIONAL / UMBILICAL / VENTRAL HERNIA REPAIR  ~ 2007   2 yrs S/P chole  . LIPOMA EXCISION Left ~ 2010   orearm  . WISDOM TOOTH EXTRACTION     Social History   Occupational History  . Not on file.   Social History Main Topics  . Smoking status: Former Smoker    Packs/day: 0.50    Years: 20.00    Types: Cigarettes    Quit date: 02/24/2015  . Smokeless tobacco: Never Used  . Alcohol use 0.0 oz/week     Comment: 06/21/2014 "last drink was in 2012"  . Drug use: No  . Sexual activity: Yes

## 2016-10-19 ENCOUNTER — Other Ambulatory Visit: Payer: Self-pay | Admitting: Family Medicine

## 2016-10-20 ENCOUNTER — Institutional Professional Consult (permissible substitution): Payer: BLUE CROSS/BLUE SHIELD | Admitting: Pulmonary Disease

## 2016-10-30 ENCOUNTER — Other Ambulatory Visit: Payer: Self-pay | Admitting: Internal Medicine

## 2016-11-04 NOTE — Progress Notes (Signed)
Need orders in epic.  Surgery on 4/27.   Preop on 4/17.

## 2016-11-06 ENCOUNTER — Other Ambulatory Visit (INDEPENDENT_AMBULATORY_CARE_PROVIDER_SITE_OTHER): Payer: Self-pay | Admitting: Physician Assistant

## 2016-11-08 ENCOUNTER — Other Ambulatory Visit (HOSPITAL_COMMUNITY): Payer: Self-pay | Admitting: Emergency Medicine

## 2016-11-08 ENCOUNTER — Other Ambulatory Visit (INDEPENDENT_AMBULATORY_CARE_PROVIDER_SITE_OTHER): Payer: Self-pay | Admitting: Orthopaedic Surgery

## 2016-11-08 NOTE — Patient Instructions (Addendum)
Scott Avery  11/08/2016   Your procedure is scheduled on: 11-19-16  Report to [facility] Main  Entrance Take [place]  elevators to 3rd floor to  [place] at [phone].  Call this number if you have problems the morning of surgery [phone]   Remember: ONLY 1 PERSON MAY GO WITH YOU TO SHORT STAY TO GET  READY MORNING OF [place].  Do not eat food or drink liquids :After Midnight.     Take these medicines the morning of surgery with A SIP OF WATER:  Amlodipine(norvasc), labetalol(normodyne), omeprazole(prilosec)                                 You may not have any metal on your body including hair pins and              piercings  Do not wear jewelry, make-up, lotions, powders or perfumes, deodorant    Bring CPAP mask and tubing to the hospital. We will provide the machine!!              Men may shave face and neck.   Do not bring valuables to the hospital. [place].  Contacts, dentures or bridgework may not be worn into surgery.  Leave suitcase in the car. After surgery it may be brought to your room.                 Please read over the following fact sheets you were given: _____________________________________________________________________             How to Manage Your Diabetes Before and After Surgery  Why is it important to control my blood sugar before and after surgery? . Improving blood sugar levels before and after surgery helps healing and can limit problems. . A way of improving blood sugar control is eating a healthy diet by: o  Eating less sugar and carbohydrates o  Increasing activity/exercise o  Talking with your doctor about reaching your blood sugar goals . High blood sugars (greater than 180 mg/dL) can raise your risk of infections and slow your recovery, so you will need to focus on controlling your diabetes during the weeks before surgery. . Make sure that the  doctor who takes care of your diabetes knows about your planned surgery including the date and location.  Patient Signature:  Date:   Nurse Signature:  Date:   Reviewed and Endorsed by [facility] Patient Education Committee, August 2015  [facility] - Preparing for Surgery Before surgery, you can play an important role.  Because skin is not sterile, your skin needs to be as free of germs as possible.  You can reduce the number of germs on your skin by washing with CHG (chlorahexidine gluconate) soap before surgery.  CHG is an antiseptic cleaner which kills germs and bonds with the skin to continue killing germs even after washing. Please DO NOT use if you have an allergy to CHG or antibacterial soaps.  If your skin becomes reddened/irritated stop using the CHG and inform your nurse when you arrive at Short Stay. Do not shave (including legs and underarms) for at least 48 hours prior to the first CHG shower.  You may shave your face/neck.  Please follow these instructions carefully:  1.  Shower with CHG Soap the night before surgery and the  morning of Surgery.  2.  If you choose to wash your hair, wash your hair first as usual with your  normal  shampoo.  3.  After you shampoo, rinse your hair and body thoroughly to remove the  shampoo.                           4.  Use CHG as you would any other liquid soap.  You can apply chg directly  to the skin and wash                       Gently with a scrungie or clean washcloth.  5.  Apply the CHG Soap to your body ONLY FROM THE NECK DOWN.   Do not use on face/ open                           Wound or open sores. Avoid contact with eyes, ears mouth and genitals (private parts).                       Wash face,  Genitals (private parts) with your normal soap.             6.  Wash thoroughly, paying special attention to the area where your surgery  will be performed.  7.  Thoroughly rinse your body with warm water from the neck down.  8.  DO NOT  shower/wash with your normal soap after using and rinsing off  the CHG Soap.                9.  Pat yourself dry with a clean towel.            10.  Wear clean pajamas.            11.  Place clean sheets on your bed the night of your first shower and do not  sleep with pets. Day of Surgery : Do not apply any lotions/deodorants the morning of surgery.  Please wear clean clothes to the hospital/surgery center.  FAILURE TO FOLLOW THESE INSTRUCTIONS MAY RESULT IN THE CANCELLATION OF YOUR SURGERY   ________________________________________________________________________   Incentive Spirometer  An incentive spirometer is a tool that can help keep your lungs clear and active. This tool measures how well you are filling your lungs with each breath. Taking long deep breaths may help reverse or decrease the chance of developing breathing (pulmonary) problems (especially infection) following:  A long period of time when you are unable to move or be active. BEFORE THE PROCEDURE   If the spirometer includes an indicator to show your best effort, your nurse or respiratory therapist will set it to a desired goal.  If possible, sit up straight or lean slightly forward. Try not to slouch.  Hold the incentive spirometer in an upright position. INSTRUCTIONS FOR USE  1. Sit on the edge of your bed if possible, or sit up as far as you can in bed or on a chair. 2. Hold the incentive spirometer in an upright position. 3. Breathe out normally. 4. Place the mouthpiece in your mouth and seal your lips tightly around it. 5. Breathe in slowly and as deeply as possible, raising the piston or the ball toward the top of  the column. 6. Hold your breath for 3-5 seconds or for as long as possible. Allow the piston or ball to fall to the bottom of the column. 7. Remove the mouthpiece from your mouth and breathe out normally. 8. Rest for a few seconds and repeat Steps 1 through 7 at least 10 times every 1-2 hours when  you are awake. Take your time and take a few normal breaths between deep breaths. 9. The spirometer may include an indicator to show your best effort. Use the indicator as a goal to work toward during each repetition. 10. After each set of 10 deep breaths, practice coughing to be sure your lungs are clear. If you have an incision (the cut made at the time of surgery), support your incision when coughing by placing a pillow or rolled up towels firmly against it. Once you are able to get out of bed, walk around indoors and cough well. You may stop using the incentive spirometer when instructed by your caregiver.  RISKS AND COMPLICATIONS  Take your time so you do not get dizzy or light-headed.  If you are in pain, you may need to take or ask for pain medication before doing incentive spirometry. It is harder to take a deep breath if you are having pain. AFTER USE  Rest and breathe slowly and easily.  It can be helpful to keep track of a log of your progress. Your caregiver can provide you with a simple table to help with this. If you are using the spirometer at home, follow these instructions: [place] IF:   You are having difficultly using the spirometer.  You have trouble using the spirometer as often as instructed.  Your pain medication is not giving enough relief while using the spirometer.  You develop fever of 100.5 F (38.1 C) or higher. SEEK IMMEDIATE MEDICAL CARE IF:   You cough up bloody sputum that had not been present before.  You develop fever of 102 F (38.9 C) or greater.  You develop worsening pain at or near the incision site. MAKE SURE YOU:   Understand these instructions.  Will watch your condition.  Will get help right away if you are not doing well or get worse. Document Released: 11/22/2006 Document Revised: 10/04/2011 Document Reviewed: 01/23/2007 [facility] Patient Information 2014 [place],  [place].   ________________________________________________________________________

## 2016-11-09 ENCOUNTER — Encounter (HOSPITAL_COMMUNITY)
Admission: RE | Admit: 2016-11-09 | Discharge: 2016-11-09 | Disposition: A | Discharge status: Home / Self Care | Payer: BLUE CROSS/BLUE SHIELD | Source: Ambulatory Visit | Attending: Orthopaedic Surgery | Admitting: Orthopaedic Surgery

## 2016-11-09 ENCOUNTER — Encounter (HOSPITAL_COMMUNITY): Payer: Self-pay

## 2016-11-09 DIAGNOSIS — M1611 Unilateral primary osteoarthritis, right hip: Secondary | ICD-10-CM | POA: Diagnosis not present

## 2016-11-09 DIAGNOSIS — Z01818 Encounter for other preprocedural examination: Secondary | ICD-10-CM | POA: Diagnosis present

## 2016-11-09 DIAGNOSIS — Z01812 Encounter for preprocedural laboratory examination: Secondary | ICD-10-CM | POA: Insufficient documentation

## 2016-11-09 DIAGNOSIS — I10 Essential (primary) hypertension: Secondary | ICD-10-CM | POA: Diagnosis not present

## 2016-11-09 DIAGNOSIS — Z0183 Encounter for blood typing: Secondary | ICD-10-CM | POA: Diagnosis not present

## 2016-11-09 LAB — CBC
HCT: 41.7 % (ref 39.0–52.0)
Hemoglobin: 14.1 g/dL (ref 13.0–17.0)
MCH: 29.8 pg (ref 26.0–34.0)
MCHC: 33.8 g/dL (ref 30.0–36.0)
MCV: 88.2 fL (ref 78.0–100.0)
Platelets: 187 10*3/uL (ref 150–400)
RBC: 4.73 MIL/uL (ref 4.22–5.81)
RDW: 13 % (ref 11.5–15.5)
WBC: 7.8 10*3/uL (ref 4.0–10.5)

## 2016-11-09 LAB — BASIC METABOLIC PANEL
Anion gap: 7 (ref 5–15)
BUN: 14 mg/dL (ref 6–20)
CHLORIDE: 109 mmol/L (ref 101–111)
CO2: 25 mmol/L (ref 22–32)
Calcium: 9 mg/dL (ref 8.9–10.3)
Creatinine, Ser: 0.9 mg/dL (ref 0.61–1.24)
GFR calc Af Amer: >60 mL/min (ref >60)
GLUCOSE: 172 mg/dL — AB (ref 65–99)
POTASSIUM: 3.9 mmol/L (ref 3.5–5.1)
Sodium: 141 mmol/L (ref 135–145)

## 2016-11-09 LAB — SURGICAL PCR SCREEN
MRSA, PCR: NEGATIVE
Staphylococcus aureus: NEGATIVE

## 2016-11-09 LAB — GLUCOSE, CAPILLARY: GLUCOSE-CAPILLARY: 168 mg/dL — AB (ref 65–99)

## 2016-11-10 LAB — HEMOGLOBIN A1C
Hgb A1c MFr Bld: 7.9 % — ABNORMAL HIGH (ref 4.8–5.6)
Mean Plasma Glucose: 180 mg/dL

## 2016-11-10 LAB — ABO/RH: ABO/RH(D): A POS

## 2016-11-15 ENCOUNTER — Other Ambulatory Visit: Payer: Self-pay | Admitting: Family Medicine

## 2016-11-18 MED ORDER — DEXTROSE 5 % IV SOLN
3.0000 g | INTRAVENOUS | Status: AC
  Administered 2016-11-19: 3 g via INTRAVENOUS
  Filled 2016-11-18: qty 3

## 2016-11-18 NOTE — Anesthesia Preprocedure Evaluation (Addendum)
Anesthesia Evaluation  Patient identified by MRN, date of birth, ID band Patient awake    Reviewed: Allergy & Precautions, NPO status , Patient's Chart, lab work & pertinent test results, reviewed documented beta blocker date and time   Airway Mallampati: III  TM Distance: >3 FB Neck ROM: Full    Dental no notable dental hx.    Pulmonary asthma , sleep apnea , former smoker,    Pulmonary exam normal breath sounds clear to auscultation       Cardiovascular hypertension, Pt. on medications and Pt. on home beta blockers Normal cardiovascular exam Rhythm:Regular Rate:Normal     Neuro/Psych negative neurological ROS  negative psych ROS   GI/Hepatic Neg liver ROS, hiatal hernia, GERD  ,  Endo/Other  diabetes, Type 2Morbid obesity  Renal/GU negative Renal ROS     Musculoskeletal  (+) Arthritis ,   Abdominal (+) + obese,   Peds  Hematology negative hematology ROS (+)   Anesthesia Other Findings   Reproductive/Obstetrics                            Anesthesia Physical Anesthesia Plan  ASA: III  Anesthesia Plan: Spinal   Post-op Pain Management:    Induction:   Airway Management Planned:   Additional Equipment:   Intra-op Plan:   Post-operative Plan:   Informed Consent: I have reviewed the patients History and Physical, chart, labs and discussed the procedure including the risks, benefits and alternatives for the proposed anesthesia with the patient or authorized representative who has indicated his/her understanding and acceptance.   Dental advisory given  Plan Discussed with: CRNA  Anesthesia Plan Comments:        Anesthesia Quick Evaluation

## 2016-11-19 ENCOUNTER — Inpatient Hospital Stay (HOSPITAL_COMMUNITY): Payer: BLUE CROSS/BLUE SHIELD

## 2016-11-19 ENCOUNTER — Encounter (HOSPITAL_COMMUNITY): Payer: Self-pay | Admitting: Anesthesiology

## 2016-11-19 ENCOUNTER — Encounter (HOSPITAL_COMMUNITY)
Admission: RE | Disposition: A | Discharge status: Home Health | Payer: Self-pay | Source: Ambulatory Visit | Attending: Orthopaedic Surgery

## 2016-11-19 ENCOUNTER — Inpatient Hospital Stay (HOSPITAL_COMMUNITY): Payer: BLUE CROSS/BLUE SHIELD | Admitting: Anesthesiology

## 2016-11-19 ENCOUNTER — Inpatient Hospital Stay (HOSPITAL_COMMUNITY)
Admission: RE | Admit: 2016-11-19 | Discharge: 2016-11-22 | DRG: 470 | Disposition: A | Discharge status: Home Health | Payer: BLUE CROSS/BLUE SHIELD | Source: Ambulatory Visit | Attending: Orthopaedic Surgery | Admitting: Orthopaedic Surgery

## 2016-11-19 DIAGNOSIS — M1611 Unilateral primary osteoarthritis, right hip: Secondary | ICD-10-CM | POA: Diagnosis present

## 2016-11-19 DIAGNOSIS — Z79899 Other long term (current) drug therapy: Secondary | ICD-10-CM | POA: Diagnosis not present

## 2016-11-19 DIAGNOSIS — Z9181 History of falling: Secondary | ICD-10-CM

## 2016-11-19 DIAGNOSIS — K760 Fatty (change of) liver, not elsewhere classified: Secondary | ICD-10-CM | POA: Diagnosis present

## 2016-11-19 DIAGNOSIS — E781 Pure hyperglyceridemia: Secondary | ICD-10-CM | POA: Diagnosis present

## 2016-11-19 DIAGNOSIS — G4733 Obstructive sleep apnea (adult) (pediatric): Secondary | ICD-10-CM | POA: Diagnosis present

## 2016-11-19 DIAGNOSIS — Z8249 Family history of ischemic heart disease and other diseases of the circulatory system: Secondary | ICD-10-CM | POA: Diagnosis not present

## 2016-11-19 DIAGNOSIS — Z6841 Body Mass Index (BMI) 40.0 and over, adult: Secondary | ICD-10-CM

## 2016-11-19 DIAGNOSIS — I10 Essential (primary) hypertension: Secondary | ICD-10-CM | POA: Diagnosis present

## 2016-11-19 DIAGNOSIS — Z96641 Presence of right artificial hip joint: Secondary | ICD-10-CM

## 2016-11-19 DIAGNOSIS — M25751 Osteophyte, right hip: Secondary | ICD-10-CM | POA: Diagnosis present

## 2016-11-19 DIAGNOSIS — E119 Type 2 diabetes mellitus without complications: Secondary | ICD-10-CM | POA: Diagnosis present

## 2016-11-19 DIAGNOSIS — Z87891 Personal history of nicotine dependence: Secondary | ICD-10-CM | POA: Diagnosis not present

## 2016-11-19 DIAGNOSIS — Z419 Encounter for procedure for purposes other than remedying health state, unspecified: Secondary | ICD-10-CM

## 2016-11-19 DIAGNOSIS — K219 Gastro-esophageal reflux disease without esophagitis: Secondary | ICD-10-CM | POA: Diagnosis present

## 2016-11-19 DIAGNOSIS — Z833 Family history of diabetes mellitus: Secondary | ICD-10-CM | POA: Diagnosis not present

## 2016-11-19 DIAGNOSIS — M25551 Pain in right hip: Secondary | ICD-10-CM | POA: Diagnosis present

## 2016-11-19 HISTORY — PX: TOTAL HIP ARTHROPLASTY: SHX124

## 2016-11-19 LAB — TYPE AND SCREEN
ABO/RH(D): A POS
Antibody Screen: NEGATIVE

## 2016-11-19 LAB — GLUCOSE, CAPILLARY
GLUCOSE-CAPILLARY: 132 mg/dL — AB (ref 65–99)
GLUCOSE-CAPILLARY: 141 mg/dL — AB (ref 65–99)
Glucose-Capillary: 156 mg/dL — ABNORMAL HIGH (ref 65–99)
Glucose-Capillary: 135 mg/dL — ABNORMAL HIGH (ref 65–99)

## 2016-11-19 SURGERY — ARTHROPLASTY, HIP, TOTAL, ANTERIOR APPROACH
Anesthesia: Spinal | Site: Hip | Laterality: Right

## 2016-11-19 MED ORDER — ONDANSETRON HCL 4 MG/2ML IJ SOLN
INTRAMUSCULAR | Status: DC | PRN
  Administered 2016-11-19: 4 mg via INTRAVENOUS

## 2016-11-19 MED ORDER — HYDROMORPHONE HCL 1 MG/ML IJ SOLN
0.2500 mg | INTRAMUSCULAR | Status: DC | PRN
  Administered 2016-11-19 (×2): 0.5 mg via INTRAVENOUS

## 2016-11-19 MED ORDER — CEFAZOLIN SODIUM-DEXTROSE 2-4 GM/100ML-% IV SOLN
2.0000 g | Freq: Four times a day (QID) | INTRAVENOUS | Status: AC
  Administered 2016-11-19 (×2): 2 g via INTRAVENOUS
  Filled 2016-11-19 (×2): qty 100

## 2016-11-19 MED ORDER — MEPERIDINE HCL 50 MG/ML IJ SOLN: 6.2500 mg | INTRAMUSCULAR | Status: DC | PRN

## 2016-11-19 MED ORDER — LOSARTAN POTASSIUM 50 MG PO TABS: 100.0000 mg | ORAL_TABLET | Freq: Every day | ORAL | Status: DC

## 2016-11-19 MED ORDER — METOCLOPRAMIDE HCL 5 MG/ML IJ SOLN: 5.0000 mg | Freq: Three times a day (TID) | INTRAMUSCULAR | Status: DC | PRN

## 2016-11-19 MED ORDER — SODIUM CHLORIDE 0.9 % IV SOLN
INTRAVENOUS | Status: DC
  Administered 2016-11-19: 13:00:00 via INTRAVENOUS

## 2016-11-19 MED ORDER — LOSARTAN POTASSIUM 50 MG PO TABS
100.0000 mg | ORAL_TABLET | Freq: Every day | ORAL | Status: DC
  Administered 2016-11-21 – 2016-11-22 (×2): 100 mg via ORAL
  Filled 2016-11-19 (×3): qty 2

## 2016-11-19 MED ORDER — ONDANSETRON HCL 4 MG/2ML IJ SOLN: 4.0000 mg | Freq: Four times a day (QID) | INTRAMUSCULAR | Status: DC | PRN

## 2016-11-19 MED ORDER — OXYCODONE HCL 5 MG PO TABS
5.0000 mg | ORAL_TABLET | ORAL | Status: DC | PRN
  Administered 2016-11-19 – 2016-11-20 (×8): 10 mg via ORAL
  Administered 2016-11-21: 21:00:00 5 mg via ORAL
  Administered 2016-11-21: 10 mg via ORAL
  Administered 2016-11-21 – 2016-11-22 (×4): 5 mg via ORAL
  Filled 2016-11-19: qty 2
  Filled 2016-11-19: qty 1
  Filled 2016-11-19 (×2): qty 2
  Filled 2016-11-19 (×2): qty 1
  Filled 2016-11-19 (×3): qty 2
  Filled 2016-11-19: qty 1
  Filled 2016-11-19 (×2): qty 2
  Filled 2016-11-19: qty 1
  Filled 2016-11-19 (×2): qty 2

## 2016-11-19 MED ORDER — HYDROMORPHONE HCL 1 MG/ML IJ SOLN
1.0000 mg | INTRAMUSCULAR | Status: DC | PRN
  Administered 2016-11-19 (×2): 1 mg via INTRAVENOUS
  Filled 2016-11-19 (×2): qty 1

## 2016-11-19 MED ORDER — LACTATED RINGERS IV SOLN
INTRAVENOUS | Status: DC
  Administered 2016-11-19 (×3): via INTRAVENOUS

## 2016-11-19 MED ORDER — PROPOFOL 10 MG/ML IV BOLUS
INTRAVENOUS | Status: DC | PRN
  Administered 2016-11-19: 30 mg via INTRAVENOUS

## 2016-11-19 MED ORDER — ACETAMINOPHEN 325 MG PO TABS: 650.0000 mg | ORAL_TABLET | Freq: Four times a day (QID) | ORAL | Status: DC | PRN

## 2016-11-19 MED ORDER — KETOROLAC TROMETHAMINE 15 MG/ML IJ SOLN
7.5000 mg | Freq: Four times a day (QID) | INTRAMUSCULAR | Status: AC
  Administered 2016-11-19 – 2016-11-20 (×4): 7.5 mg via INTRAVENOUS
  Filled 2016-11-19 (×4): qty 1

## 2016-11-19 MED ORDER — AMLODIPINE BESYLATE 5 MG PO TABS
5.0000 mg | ORAL_TABLET | Freq: Every day | ORAL | Status: DC
  Administered 2016-11-21 – 2016-11-22 (×2): 5 mg via ORAL
  Filled 2016-11-19 (×3): qty 1

## 2016-11-19 MED ORDER — PROMETHAZINE HCL 25 MG/ML IJ SOLN: 6.2500 mg | INTRAMUSCULAR | Status: DC | PRN

## 2016-11-19 MED ORDER — MENTHOL 3 MG MT LOZG: 1.0000 | LOZENGE | OROMUCOSAL | Status: DC | PRN

## 2016-11-19 MED ORDER — DIPHENHYDRAMINE HCL 12.5 MG/5ML PO ELIX: 12.5000 mg | ORAL_SOLUTION | ORAL | Status: DC | PRN

## 2016-11-19 MED ORDER — METOCLOPRAMIDE HCL 5 MG PO TABS: 5.0000 mg | ORAL_TABLET | Freq: Three times a day (TID) | ORAL | Status: DC | PRN

## 2016-11-19 MED ORDER — MIDAZOLAM HCL 5 MG/5ML IJ SOLN
INTRAMUSCULAR | Status: DC | PRN
  Administered 2016-11-19: 2 mg via INTRAVENOUS

## 2016-11-19 MED ORDER — DOCUSATE SODIUM 100 MG PO CAPS
100.0000 mg | ORAL_CAPSULE | Freq: Two times a day (BID) | ORAL | Status: DC
Start: 2016-11-19 — End: 2016-11-22
  Administered 2016-11-19 – 2016-11-22 (×6): 100 mg via ORAL
  Filled 2016-11-19 (×6): qty 1

## 2016-11-19 MED ORDER — PROPOFOL 10 MG/ML IV BOLUS
INTRAVENOUS | Status: AC
  Filled 2016-11-19: qty 60

## 2016-11-19 MED ORDER — PHENOL 1.4 % MT LIQD: 1.0000 | OROMUCOSAL | Status: DC | PRN

## 2016-11-19 MED ORDER — PROPOFOL 500 MG/50ML IV EMUL
INTRAVENOUS | Status: DC | PRN
  Administered 2016-11-19: 65 ug/kg/min via INTRAVENOUS

## 2016-11-19 MED ORDER — TRANEXAMIC ACID 1000 MG/10ML IV SOLN
1000.0000 mg | INTRAVENOUS | Status: AC
  Administered 2016-11-19: 1000 mg via INTRAVENOUS
  Filled 2016-11-19: qty 10

## 2016-11-19 MED ORDER — PANTOPRAZOLE SODIUM 40 MG PO TBEC
40.0000 mg | DELAYED_RELEASE_TABLET | Freq: Every day | ORAL | Status: DC
  Administered 2016-11-20 – 2016-11-22 (×3): 40 mg via ORAL
  Filled 2016-11-19 (×3): qty 1

## 2016-11-19 MED ORDER — GABAPENTIN 300 MG PO CAPS
300.0000 mg | ORAL_CAPSULE | Freq: Every day | ORAL | Status: DC
  Administered 2016-11-20 – 2016-11-21 (×2): 300 mg via ORAL
  Filled 2016-11-19 (×3): qty 1

## 2016-11-19 MED ORDER — LABETALOL HCL 100 MG PO TABS
300.0000 mg | ORAL_TABLET | Freq: Two times a day (BID) | ORAL | Status: DC
  Administered 2016-11-19 – 2016-11-22 (×6): 300 mg via ORAL
  Filled 2016-11-19 (×6): qty 3

## 2016-11-19 MED ORDER — ALUM & MAG HYDROXIDE-SIMETH 200-200-20 MG/5ML PO SUSP: 30.0000 mL | ORAL | Status: DC | PRN

## 2016-11-19 MED ORDER — PHENYLEPHRINE HCL 10 MG/ML IJ SOLN
INTRAMUSCULAR | Status: AC
  Filled 2016-11-19: qty 1

## 2016-11-19 MED ORDER — MIDAZOLAM HCL 2 MG/2ML IJ SOLN
INTRAMUSCULAR | Status: AC
  Filled 2016-11-19: qty 2

## 2016-11-19 MED ORDER — BUPIVACAINE HCL (PF) 0.5 % IJ SOLN
INTRAMUSCULAR | Status: DC | PRN
  Administered 2016-11-19: 3 mL

## 2016-11-19 MED ORDER — EPHEDRINE 5 MG/ML INJ
INTRAVENOUS | Status: AC
  Filled 2016-11-19: qty 10

## 2016-11-19 MED ORDER — SODIUM CHLORIDE 0.9 % IR SOLN
Status: DC | PRN
  Administered 2016-11-19: 1000 mL

## 2016-11-19 MED ORDER — ACETAMINOPHEN 650 MG RE SUPP: 650.0000 mg | Freq: Four times a day (QID) | RECTAL | Status: DC | PRN

## 2016-11-19 MED ORDER — METHOCARBAMOL 500 MG PO TABS
500.0000 mg | ORAL_TABLET | Freq: Four times a day (QID) | ORAL | Status: DC | PRN
  Administered 2016-11-20 – 2016-11-21 (×3): 500 mg via ORAL
  Filled 2016-11-19 (×3): qty 1

## 2016-11-19 MED ORDER — ONDANSETRON HCL 4 MG PO TABS: 4.0000 mg | ORAL_TABLET | Freq: Four times a day (QID) | ORAL | Status: DC | PRN

## 2016-11-19 MED ORDER — METHOCARBAMOL 1000 MG/10ML IJ SOLN
500.0000 mg | Freq: Four times a day (QID) | INTRAVENOUS | Status: DC | PRN
  Administered 2016-11-19: 500 mg via INTRAVENOUS
  Filled 2016-11-19: qty 5
  Filled 2016-11-19: qty 550

## 2016-11-19 MED ORDER — CHLORHEXIDINE GLUCONATE 4 % EX LIQD: 60.0000 mL | Freq: Once | CUTANEOUS | Status: DC

## 2016-11-19 MED ORDER — HYDROMORPHONE HCL 1 MG/ML IJ SOLN
INTRAMUSCULAR | Status: AC
  Filled 2016-11-19: qty 1

## 2016-11-19 MED ORDER — FENTANYL CITRATE (PF) 100 MCG/2ML IJ SOLN
INTRAMUSCULAR | Status: AC
  Filled 2016-11-19: qty 2

## 2016-11-19 MED ORDER — FENTANYL CITRATE (PF) 100 MCG/2ML IJ SOLN
INTRAMUSCULAR | Status: DC | PRN
  Administered 2016-11-19: 50 ug via INTRAVENOUS
  Administered 2016-11-19 (×2): 25 ug via INTRAVENOUS

## 2016-11-19 MED ORDER — ASPIRIN 81 MG PO CHEW
81.0000 mg | CHEWABLE_TABLET | Freq: Two times a day (BID) | ORAL | Status: DC
  Administered 2016-11-19 – 2016-11-22 (×6): 81 mg via ORAL
  Filled 2016-11-19 (×6): qty 1

## 2016-11-19 MED ORDER — POLYETHYLENE GLYCOL 3350 17 G PO PACK
17.0000 g | PACK | Freq: Every day | ORAL | Status: DC | PRN
  Administered 2016-11-22: 10:00:00 17 g via ORAL
  Filled 2016-11-19: qty 1

## 2016-11-19 SURGICAL SUPPLY — 41 items
APL SKNCLS STERI-STRIP NONHPOA (GAUZE/BANDAGES/DRESSINGS)
BAG SPEC THK2 15X12 ZIP CLS (MISCELLANEOUS)
BAG ZIPLOCK 12X15 (MISCELLANEOUS) IMPLANT
BENZOIN TINCTURE PRP APPL 2/3 (GAUZE/BANDAGES/DRESSINGS) IMPLANT
BLADE SAW SGTL 18X1.27X75 (BLADE) ×2 IMPLANT
BLADE SAW SGTL 18X1.27X75MM (BLADE) ×1
CAPT HIP TOTAL 2 ×2 IMPLANT
CELLS DAT CNTRL 66122 CELL SVR (MISCELLANEOUS) ×1 IMPLANT
CLOSURE WOUND 1/2 X4 (GAUZE/BANDAGES/DRESSINGS)
CLOTH BEACON ORANGE TIMEOUT ST (SAFETY) ×3 IMPLANT
COVER PERINEAL POST (MISCELLANEOUS) ×3 IMPLANT
COVER SURGICAL LIGHT HANDLE (MISCELLANEOUS) ×3 IMPLANT
DRAPE STERI IOBAN 125X83 (DRAPES) ×3 IMPLANT
DRAPE U-SHAPE 47X51 STRL (DRAPES) ×6 IMPLANT
DRSG AQUACEL AG ADV 3.5X10 (GAUZE/BANDAGES/DRESSINGS) ×3 IMPLANT
DURAPREP 26ML APPLICATOR (WOUND CARE) ×3 IMPLANT
ELECT REM PT RETURN 15FT ADLT (MISCELLANEOUS) ×3 IMPLANT
GAUZE XEROFORM 1X8 LF (GAUZE/BANDAGES/DRESSINGS) ×2 IMPLANT
GLOVE BIO SURGEON STRL SZ7.5 (GLOVE) ×3 IMPLANT
GLOVE BIOGEL PI IND STRL 7.0 (GLOVE) IMPLANT
GLOVE BIOGEL PI IND STRL 8 (GLOVE) ×2 IMPLANT
GLOVE BIOGEL PI INDICATOR 7.0 (GLOVE) ×8
GLOVE BIOGEL PI INDICATOR 8 (GLOVE) ×4
GLOVE ECLIPSE 8.0 STRL XLNG CF (GLOVE) ×3 IMPLANT
GOWN STRL REUS W/TWL XL LVL3 (GOWN DISPOSABLE) ×10 IMPLANT
HANDPIECE INTERPULSE COAX TIP (DISPOSABLE) ×3
HOLDER FOLEY CATH W/STRAP (MISCELLANEOUS) ×3 IMPLANT
PACK ANTERIOR HIP CUSTOM (KITS) ×3 IMPLANT
RETRACTOR WND ALEXIS 18 MED (MISCELLANEOUS) ×1 IMPLANT
RTRCTR WOUND ALEXIS 18CM MED (MISCELLANEOUS) ×3
SET HNDPC FAN SPRY TIP SCT (DISPOSABLE) ×1 IMPLANT
STAPLER VISISTAT 35W (STAPLE) ×2 IMPLANT
STRIP CLOSURE SKIN 1/2X4 (GAUZE/BANDAGES/DRESSINGS) IMPLANT
SUT ETHIBOND NAB CT1 #1 30IN (SUTURE) ×3 IMPLANT
SUT MNCRL AB 4-0 PS2 18 (SUTURE) IMPLANT
SUT VIC AB 0 CT1 36 (SUTURE) ×3 IMPLANT
SUT VIC AB 1 CT1 36 (SUTURE) ×3 IMPLANT
SUT VIC AB 2-0 CT1 27 (SUTURE) ×6
SUT VIC AB 2-0 CT1 TAPERPNT 27 (SUTURE) ×2 IMPLANT
TRAY FOLEY W/METER SILVER 16FR (SET/KITS/TRAYS/PACK) ×3 IMPLANT
YANKAUER SUCT BULB TIP 10FT TU (MISCELLANEOUS) ×3 IMPLANT

## 2016-11-19 NOTE — Anesthesia Procedure Notes (Signed)
Spinal  Patient location during procedure: OR Staffing Anesthesiologist: Stephanos Fan Performed: anesthesiologist  Preanesthetic Checklist Completed: patient identified, site marked, surgical consent, pre-op evaluation, timeout performed, IV checked, risks and benefits discussed and monitors and equipment checked Spinal Block Patient position: sitting Prep: ChloraPrep and site prepped and draped Patient monitoring: heart rate, continuous pulse ox and blood pressure Approach: right paramedian Location: L3-4 Injection technique: single-shot Needle Needle type: Sprotte  Needle gauge: 24 G Needle length: 9 cm Additional Notes Expiration date of kit checked and confirmed. Patient tolerated procedure well, without complications.       

## 2016-11-19 NOTE — Brief Op Note (Signed)
11/19/2016  11:11 AM  PATIENT:  Kallie Locks Cadden  51 y.o. male  PRE-OPERATIVE DIAGNOSIS:  right hip osteoarthritis  POST-OPERATIVE DIAGNOSIS:  right hip osteoarthritis  PROCEDURE:  Procedure(s): RIGHT TOTAL HIP ARTHROPLASTY ANTERIOR APPROACH (Right)  SURGEON:  Surgeon(s) and Role:    * Mcarthur Rossetti, MD - Primary  PHYSICIAN ASSISTANT: Benita Stabile, PA-C  ANESTHESIA:   spinal  EBL:  Total I/O In: 1800 [I.V.:1800] Out: 600 [Urine:200; Blood:400]  COUNTS:  YES  DICTATION: .Other Dictation: Dictation Number [id number]  PLAN OF CARE: Admit to inpatient   PATIENT DISPOSITION:  PACU - hemodynamically stable.   Delay start of Pharmacological VTE agent (>24hrs) due to surgical blood loss or risk of bleeding: no

## 2016-11-19 NOTE — Progress Notes (Signed)
RT got CPAP ready for patient to put on . Patient was not ready. Patient will self administer when ready.

## 2016-11-19 NOTE — Evaluation (Signed)
Physical Therapy Evaluation Patient Details Name: Scott Avery MRN: [id number] DOB: [birth year] Today's Date: 11/19/2016   History of Present Illness  Pt s/p R THR and with hx of DM  Clinical Impression  Pt s/p R THR and presents with decreased R LE strength/ROM, post op pain and obesity limiting functional mobility.  Pt should progress to dc home with family assist.    Follow Up Recommendations Home health PT    Equipment Recommendations  Rolling walker with 5" wheels (Pt is 350 lbs)    Recommendations for Other Services OT consult     Precautions / Restrictions Precautions Precautions: Fall Restrictions Weight Bearing Restrictions: No Other Position/Activity Restrictions: WBAT      Mobility  Bed Mobility Overal bed mobility: Needs Assistance Bed Mobility: Supine to Sit     Supine to sit: Min assist;+2 for safety/equipment;HOB elevated     General bed mobility comments: Pt utilizing trapeze bar and bed rail to manouver   Transfers Overall transfer level: Needs assistance Equipment used: Rolling walker (2 wheeled) Transfers: Sit to/from Stand Sit to Stand: Min assist;+2 safety/equipment;From elevated surface         General transfer comment: cues for LE management and use of UEs to self assist  Ambulation/Gait Ambulation/Gait assistance: Min assist;+2 safety/equipment Ambulation Distance (Feet): 60 Feet Assistive device: Rolling walker (2 wheeled) Gait Pattern/deviations: Step-to pattern;Decreased step length - right;Decreased step length - left;Shuffle;Trunk flexed Gait velocity: decr Gait velocity interpretation: Below normal speed for age/gender General Gait Details: cues for sequence, posture and position from [employer]            Wheelchair Mobility    Modified Rankin (Stroke Patients Only)       Balance Overall balance assessment: No apparent balance deficits (not formally assessed)                                            Pertinent Vitals/Pain Pain Assessment: 0-10 Pain Score: 5  Pain Location: R hip/thigh Pain Descriptors / Indicators: Burning;Aching Pain Intervention(s): Limited activity within patient's tolerance;Monitored during session;Premedicated before session;Ice applied    Home Living Family/patient expects to be discharged to:: Private residence Living Arrangements: Spouse/significant other Available Help at Discharge: Family Type of Home: House Home Access: Stairs to enter Entrance Stairs-Rails: None Technical brewer of Steps: 3 Home Layout: One level Home Equipment: None      Prior Function Level of Independence: Independent               Hand Dominance        Extremity/Trunk Assessment   Upper Extremity Assessment Upper Extremity Assessment: Overall WFL for tasks assessed    Lower Extremity Assessment Lower Extremity Assessment: RLE deficits/detail    Cervical / Trunk Assessment Cervical / Trunk Assessment: Normal  Communication   Communication: No difficulties  Cognition Arousal/Alertness: Awake/alert Behavior During Therapy: WFL for tasks assessed/performed Overall Cognitive Status: Within Functional Limits for tasks assessed                                        General Comments      Exercises Total Joint Exercises Ankle Circles/Pumps: AROM;Both;15 reps;Supine   Assessment/Plan    PT Assessment Patient needs continued PT services  PT Problem List Decreased strength;Decreased range of motion;Decreased  activity tolerance;Decreased mobility;Obesity;Pain;Decreased knowledge of use of DME       PT Treatment Interventions DME instruction;Gait training;Stair training;Functional mobility training;Therapeutic activities;Therapeutic exercise;Patient/family education    PT Goals (Current goals can be found in the Care Plan section)  Acute Rehab PT Goals Patient Stated Goal: Regain IND and walk without pain PT Goal  Formulation: With patient Time For Goal Achievement: 11/24/16 Potential to Achieve Goals: Good    Frequency 7X/week   Barriers to discharge        Co-evaluation               End of Session   Activity Tolerance: Patient tolerated treatment well Patient left: in chair;with call bell/phone within reach;with chair alarm set;with family/visitor present Nurse Communication: Mobility status PT Visit Diagnosis: Unsteadiness on feet (R26.81);Difficulty in walking, not elsewhere classified (R26.2)    Time: 8182-9937 PT Time Calculation (min) (ACUTE ONLY): 26 min   Charges:   PT Evaluation $PT Eval Low Complexity: 1 Procedure PT Treatments $Gait Training: 8-22 mins   PT G Codes:        Pg [phone]   Scott Avery 11/19/2016, 4:48 PM

## 2016-11-19 NOTE — H&P (Signed)
TOTAL HIP ADMISSION H&P  Patient is admitted for right total hip arthroplasty.  Subjective:  Chief Complaint: right hip pain  HPI: Scott Avery, 51 y.o. male, has a history of pain and functional disability in the right hip(s) due to arthritis and patient has failed non-surgical conservative treatments for greater than 12 weeks to include NSAID's and/or analgesics, corticosteriod injections, flexibility and strengthening excercises, weight reduction as appropriate and activity modification.  Onset of symptoms was gradual starting 3 years ago with gradually worsening course since that time.The patient noted no past surgery on the right hip(s).  Patient currently rates pain in the right hip at 10 out of 10 with activity. Patient has night pain, worsening of pain with activity and weight bearing, trendelenberg gait, pain that interfers with activities of daily living, pain with passive range of motion and crepitus. Patient has evidence of subchondral cysts, subchondral sclerosis, periarticular osteophytes and joint space narrowing by imaging studies. This condition presents safety issues increasing the risk of falls.  There is no current active infection.  Patient Active Problem List   Diagnosis Date Noted  . Unilateral primary osteoarthritis, right hip 10/13/2016  . Arthritis of right hip 09/13/2016  . Right hip pain 08/16/2016  . Generalized abdominal pain 04/06/2016  . Morbid obesity ([place]) 10/03/2015  . Fatty liver 10/03/2015  . SBO (small bowel obstruction) ([place]) 06/21/2014  . Lumbar radiculopathy 05/17/2014  . Microscopic hematuria 04/19/2014  . Plantar fasciitis of left foot 03/26/2014  . Diabetes mellitus with hyperglycemia ([place]) 03/23/2014  . Hypertriglyceridemia 03/23/2014  . Sleep apnea 07/31/2012  . Umbilical hernia 03/40/3524  . GERD 07/22/2009  . Essential hypertension 08/23/2007   Past Medical History:  Diagnosis Date  . Arthritis    "hands; right hip" (06/21/2014)  .  Childhood asthma   . Gastroenteritis   . GERD (gastroesophageal reflux disease)   . Hypertension   . Hypertriglyceridemia   . Morbid obesity ([place])   . OSA on CPAP   . SBO (small bowel obstruction) ([place]) 05/2014  . Type II diabetes mellitus ([place]) dx'd ~ 03/2014   diet controlled     Past Surgical History:  Procedure Laterality Date  . HERNIA REPAIR    . LAPAROSCOPIC CHOLECYSTECTOMY  2005  . LAPAROSCOPIC INCISIONAL / UMBILICAL / VENTRAL HERNIA REPAIR  ~ 2007   2 yrs S/P chole  . LIPOMA EXCISION Left ~ 2010   [facility]arm  . WISDOM TOOTH EXTRACTION      Prescriptions Prior to Admission  Medication Sig Dispense Refill Last Dose  . amLODipine (NORVASC) 5 MG tablet TAKE 1 TABLET BY MOUTH EVERY DAY 90 tablet 1 Taking  . gabapentin (NEURONTIN) 100 MG capsule Take 2 capsules (200 mg total) by mouth at bedtime. (Patient taking differently: Take 200 mg by mouth at bedtime as needed (pain). ) 60 capsule 3 Taking  . ibuprofen (ADVIL,MOTRIN) 200 MG tablet Take 200-400 mg by mouth every 6 (six) hours as needed for mild pain.     Marland Kitchen labetalol (NORMODYNE) 300 MG tablet Take 1 tablet (300 mg total) by mouth 2 (two) times daily. -- Office visit needed for further refills 180 tablet 0   . losartan (COZAAR) 100 MG tablet TAKE 1 TABLET BY MOUTH EVERY DAY 30 tablet 5 Taking  . meloxicam (MOBIC) 15 MG tablet TAKE 1 TABLET BY MOUTH EVERY DAY 30 tablet 1   . omeprazole (PRILOSEC) 20 MG capsule Take 20 mg by mouth daily.   Taking  . promethazine (PHENERGAN) 25 MG  tablet Take 1 tablet (25 mg total) by mouth every 8 (eight) hours as needed for nausea or vomiting. 30 tablet 0 Not Taking  . traMADol (ULTRAM) 50 MG tablet Take 1 tablet (50 mg total) by mouth every 8 (eight) hours as needed. (Patient taking differently: Take 50 mg by mouth at bedtime as needed for moderate pain. ) 30 tablet 0 Not Taking  . ipratropium (ATROVENT) 0.03 % nasal spray Place 2 sprays into both nostrils 2 (two) times daily. (Patient not taking:  Reported on 11/04/2016) 30 mL 0 Not Taking at Unknown time  . Vitamin D, Ergocalciferol, (DRISDOL) 50000 units CAPS capsule TAKE 1 CAPSULE (50,000 UNITS TOTAL) BY MOUTH EVERY 7 (SEVEN) DAYS. 12 capsule 0    No Known Allergies  Social History  Substance Use Topics  . Smoking status: Former Smoker    Packs/day: 0.50    Years: 20.00    Types: Cigarettes  . Smokeless tobacco: Never Used  . Alcohol use 0.0 oz/week     Comment: 06/21/2014 "last drink was in 2012"    Family History  Problem Relation Age of Onset  . Heart disease Father     MI @ 31 in context of PNA  . Hypertension Father   . Leukemia Maternal Grandfather   . Prostate cancer Paternal Grandfather   . Diabetes Paternal Grandfather   . Stroke Neg Hx      Review of Systems  Musculoskeletal: Positive for joint pain.  All other systems reviewed and are negative.   Objective:  Physical Exam  Constitutional: He is oriented to person, place, and time. He appears well-developed and well-nourished.  HENT:  Head: Normocephalic and atraumatic.  Eyes: EOM are normal. Pupils are equal, round, and reactive to light.  Neck: Normal range of motion. Neck supple.  Cardiovascular: Normal rate and regular rhythm.   Respiratory: Effort normal and breath sounds normal.  GI: Soft. Bowel sounds are normal.  Musculoskeletal:       Right hip: He exhibits decreased range of motion, decreased strength, tenderness and bony tenderness.  Neurological: He is alert and oriented to person, place, and time.  Skin: Skin is warm and dry.  Psychiatric: He has a normal mood and affect.    Vital signs in last 24 hours:    Labs:   Estimated body mass index is 48.74 kg/m as calculated from the following:   Height as of 11/09/16: 6' (1.829 m).   Weight as of 11/09/16: 359 lb 6.4 oz (163 kg).   Imaging Review Plain radiographs demonstrate severe degenerative joint disease of the right hip(s). The bone quality appears to be excellent for age and  reported activity level.  Assessment/Plan:  End stage arthritis, right hip(s)  The patient history, physical examination, clinical judgement of the provider and imaging studies are consistent with end stage degenerative joint disease of the right hip(s) and total hip arthroplasty is deemed medically necessary. The treatment options including medical management, injection therapy, arthroscopy and arthroplasty were discussed at length. The risks and benefits of total hip arthroplasty were presented and reviewed. The risks due to aseptic loosening, infection, stiffness, dislocation/subluxation,  thromboembolic complications and other imponderables were discussed.  The patient acknowledged the explanation, agreed to proceed with the plan and consent was signed. Patient is being admitted for inpatient treatment for surgery, pain control, PT, OT, prophylactic antibiotics, VTE prophylaxis, progressive ambulation and ADL's and discharge planning.The patient is planning to be discharged home with home health services

## 2016-11-19 NOTE — Anesthesia Postprocedure Evaluation (Signed)
Anesthesia Post Note  Patient: Scott Avery  Procedure(s) Performed: Procedure(s) (LRB): RIGHT TOTAL HIP ARTHROPLASTY ANTERIOR APPROACH (Right)  Patient location during evaluation: PACU Anesthesia Type: Spinal Level of consciousness: awake and alert Pain management: pain level controlled Vital Signs Assessment: post-procedure vital signs reviewed and stable Respiratory status: spontaneous breathing Cardiovascular status: stable Postop Assessment: spinal receding Anesthetic complications: no       Last Vitals:  Vitals:   11/19/16 1247 11/19/16 1310  BP: 139/72 132/74  Pulse: 62 67  Resp: 15 20  Temp: 36.3 C 36.6 C    Last Pain:  Vitals:   11/19/16 1247  TempSrc:   PainSc: [place]

## 2016-11-19 NOTE — H&P (Signed)
  The patient is here for a right total hip replacement.  There has been no change in his medical status since he was seen in our office.  He understands fully that he is here for a right total hip replacement.  Risks and benefits have been discussed and informed consent obtained.  The right hip has been marked.

## 2016-11-19 NOTE — Transfer of Care (Signed)
Immediate Anesthesia Transfer of Care Note  Patient: Scott Avery  Procedure(s) Performed: Procedure(s): RIGHT TOTAL HIP ARTHROPLASTY ANTERIOR APPROACH (Right)  Patient Location: PACU  Anesthesia Type:Spinal  Level of Consciousness:  sedated, patient cooperative and responds to stimulation  Airway & Oxygen Therapy:Patient Spontanous Breathing and Patient connected to face mask oxgen  Post-op Assessment:  Report given to PACU RN and Post -op Vital signs reviewed and stable  Post vital signs:  Reviewed and stable  Last Vitals:  Vitals:   11/19/16 0725  BP: (!) 144/89  Pulse: 68  Resp: 18  Temp: 78.5 C    Complications: No apparent anesthesia complications

## 2016-11-20 LAB — CBC
HCT: 35 % — ABNORMAL LOW (ref 39.0–52.0)
Hemoglobin: 11.7 g/dL — ABNORMAL LOW (ref 13.0–17.0)
MCH: 29.5 pg (ref 26.0–34.0)
MCHC: 33.4 g/dL (ref 30.0–36.0)
MCV: 88.2 fL (ref 78.0–100.0)
PLATELETS: 171 10*3/uL (ref 150–400)
RBC: 3.97 MIL/uL — AB (ref 4.22–5.81)
RDW: 13.2 % (ref 11.5–15.5)
WBC: 8.2 10*3/uL (ref 4.0–10.5)

## 2016-11-20 LAB — GLUCOSE, CAPILLARY
GLUCOSE-CAPILLARY: 142 mg/dL — AB (ref 65–99)
GLUCOSE-CAPILLARY: 120 mg/dL — AB (ref 65–99)
Glucose-Capillary: 137 mg/dL — ABNORMAL HIGH (ref 65–99)
Glucose-Capillary: 113 mg/dL — ABNORMAL HIGH (ref 65–99)

## 2016-11-20 LAB — BASIC METABOLIC PANEL
Anion gap: 9 (ref 5–15)
BUN: 17 mg/dL (ref 6–20)
CALCIUM: 8.3 mg/dL — AB (ref 8.9–10.3)
CO2: 26 mmol/L (ref 22–32)
CREATININE: 0.9 mg/dL (ref 0.61–1.24)
Chloride: 100 mmol/L — ABNORMAL LOW (ref 101–111)
GFR calc Af Amer: >60 mL/min (ref >60)
GLUCOSE: 142 mg/dL — AB (ref 65–99)
Potassium: 3.7 mmol/L (ref 3.5–5.1)
SODIUM: 135 mmol/L (ref 135–145)

## 2016-11-20 MED ORDER — OXYCODONE-ACETAMINOPHEN 5-325 MG PO TABS: 1.0000 | ORAL_TABLET | ORAL | 0 refills | Status: DC | PRN

## 2016-11-20 MED ORDER — METHOCARBAMOL 500 MG PO TABS: 500.0000 mg | ORAL_TABLET | Freq: Four times a day (QID) | ORAL | 0 refills | Status: DC | PRN

## 2016-11-20 MED ORDER — ASPIRIN 81 MG PO CHEW: 81.0000 mg | CHEWABLE_TABLET | Freq: Two times a day (BID) | ORAL | 0 refills | Status: AC

## 2016-11-20 NOTE — Evaluation (Signed)
Occupational Therapy Evaluation Patient Details Name: Scott Avery MRN: [id number] DOB: 1966-04-17 Today's Date: 11/20/2016    History of Present Illness Pt s/p R THR and with hx of DM   Clinical Impression   Pt is a 51 y/o M who presents with the above. Pt presents below baseline with ADLs and functional mobility with greatest deficits in LB dressing and LB bathing. Pt will have 24 hour assist from family upon return home for ADL assist PRN. Pt plans to sponge bath initially until he is able to safely complete tub transfer;education provided on technique for completing safe transfer. Pt reports having no concerns completing ADLs upon return home and with family assist PRN. Education provided and questions answered. No further acute OT needs at this time. Will sign off.     Follow Up Recommendations  No OT follow up;Supervision/Assistance - 24 hour    Equipment Recommendations  None recommended by OT (Pt has 3:1 at home )           Precautions / Restrictions Precautions Precautions: Fall Restrictions Weight Bearing Restrictions: No Other Position/Activity Restrictions: WBAT      Mobility Bed Mobility Overal bed mobility: Needs Assistance             General bed mobility comments: OOB in chair   Transfers Overall transfer level: Needs assistance Equipment used: Rolling walker (2 wheeled) Transfers: Sit to/from Stand Sit to Stand: Min guard         General transfer comment: verbal cues for LE management and use of UEs to self assist    Balance Overall balance assessment: No apparent balance deficits (not formally assessed)                                         ADL either performed or assessed with clinical judgement   ADL Overall ADL's : Needs assistance/impaired Eating/Feeding: Independent;Sitting   Grooming: Wash/dry hands;Min guard;Standing   Upper Body Bathing: Min guard;Sitting   Lower Body Bathing: Minimal assistance;Sit  to/from stand   Upper Body Dressing : Min guard;Sitting   Lower Body Dressing: Maximal assistance;Sit to/from stand   Toilet Transfer: Min guard;Ambulation;BSC;RW Toilet Transfer Details (indicate cue type and reason): BSC over toilet  Toileting- Clothing Manipulation and Hygiene: Min guard;Sit to/from stand       Functional mobility during ADLs: Min guard;Rolling walker General ADL Comments: Pt completed room level functional mobility, toilet transfer, toileting, standing ADLs, educated on AE and compensatory techniques for completing ADLs; educated on use of 3:1 in shower to increase safety during bathing task                          Pertinent Vitals/Pain Pain Assessment: 0-10 Pain Score: 5  Pain Location: R hip/thigh Pain Descriptors / Indicators: Aching;Sore Pain Intervention(s): Limited activity within patient's tolerance;Monitored during session;Ice applied;Patient requesting pain meds-RN notified          Extremity/Trunk Assessment Upper Extremity Assessment Upper Extremity Assessment: Overall WFL for tasks assessed           Communication Communication Communication: No difficulties   Cognition Arousal/Alertness: Awake/alert Behavior During Therapy: WFL for tasks assessed/performed Overall Cognitive Status: Within Functional Limits for tasks assessed  General Comments                Home Living Family/patient expects to be discharged to:: Private residence Living Arrangements: Spouse/significant other Available Help at Discharge: Family;Available 24 hours/day Type of Home: House       Home Layout: One level     Bathroom Shower/Tub: Teacher, early years/pre: Standard     Home Equipment: Bedside commode          Prior Functioning/Environment Level of Independence: Independent                 OT Problem List: Decreased strength;Decreased activity tolerance             OT Goals(Current goals can be found in the care plan section) Acute Rehab OT Goals Patient Stated Goal: Regain IND and walk without pain OT Goal Formulation: With patient  OT Frequency:                               End of Session Equipment Utilized During Treatment: Gait belt;Rolling walker Nurse Communication: Mobility status  Activity Tolerance: Patient tolerated treatment well Patient left: in chair;with call bell/phone within reach;with nursing/sitter in room;with family/visitor present  OT Visit Diagnosis: Muscle weakness (generalized) (M62.81)                Time: 1140-1201 OT Time Calculation (min): 21 min Charges:  OT General Charges $OT Visit: 1 Procedure OT Evaluation $OT Eval Low Complexity: 1 Procedure G-Codes:     Scott Avery, OT Pager [phone] 11/20/2016   Scott Avery 11/20/2016, 12:31 PM

## 2016-11-20 NOTE — Discharge Instructions (Signed)

## 2016-11-20 NOTE — Progress Notes (Signed)
Subjective: 1 Day Post-Op Procedure(s) (LRB): RIGHT TOTAL HIP ARTHROPLASTY ANTERIOR APPROACH (Right) Patient reports pain as moderate.    Objective: Vital signs in last 24 hours: Temp:  [97.4 F (36.3 C)-98.8 F (37.1 C)] 98 F (36.7 C) (04/28 0641) Pulse Rate:  [62-73] 71 (04/28 0641) Resp:  [13-20] 18 (04/28 0641) BP: (117-143)/(64-83) 126/64 (04/28 0833) SpO2:  [95 %-100 %] 96 % (04/28 0641)  Intake/Output from previous day: 04/27 0701 - 04/28 0700 In: 3840 [P.O.:500; I.V.:3140] Out: 1725 [Urine:1325; Blood:400] Intake/Output this shift: Total I/O In: 620 [P.O.:360; I.V.:260] Out: -    Recent Labs  11/20/16 0458  HGB 11.7*    Recent Labs  11/20/16 0458  WBC 8.2  RBC 3.97*  HCT 35.0*  PLT 171    Recent Labs  11/20/16 0458  NA 135  K 3.7  CL 100*  CO2 26  BUN 17  CREATININE 0.90  GLUCOSE 142*  CALCIUM 8.3*   No results for input(s): LABPT, INR in the last 72 hours.  Sensation intact distally Intact pulses distally Dorsiflexion/Plantar flexion intact Incision: scant drainage  Assessment/Plan: 1 Day Post-Op Procedure(s) (LRB): RIGHT TOTAL HIP ARTHROPLASTY ANTERIOR APPROACH (Right) Up with therapy Plan for discharge tomorrow Discharge home with home health  Mcarthur Rossetti 11/20/2016, 10:34 AM

## 2016-11-20 NOTE — Progress Notes (Signed)
Physical Therapy Treatment Patient Details Name: Scott Avery MRN: [id number] DOB: May 26, 1966 Today's Date: 11/20/2016    History of Present Illness Pt s/p R THR and with hx of DM    PT Comments    Pt progressing steadily with mobility and hopeful for dc home tomorrow.  Follow Up Recommendations  Home health PT     Equipment Recommendations  Rolling walker with 5" wheels    Recommendations for Other Services OT consult     Precautions / Restrictions Precautions Precautions: Fall Restrictions Weight Bearing Restrictions: No Other Position/Activity Restrictions: WBAT    Mobility  Bed Mobility Overal bed mobility: Needs Assistance Bed Mobility: Sit to Supine       Sit to supine: Min assist   General bed mobility comments: cues for sequence and min assist for R LE into bed  Transfers Overall transfer level: Needs assistance Equipment used: Rolling walker (2 wheeled) Transfers: Sit to/from Stand Sit to Stand: Supervision         General transfer comment: verbal cues for LE management and use of UEs to self assist  Ambulation/Gait Ambulation/Gait assistance: Min guard;Supervision Ambulation Distance (Feet): 222 Feet (and 18' back from bathroom) Assistive device: Rolling walker (2 wheeled) Gait Pattern/deviations: Decreased step length - right;Decreased step length - left;Shuffle;Trunk flexed;Step-to pattern;Step-through pattern Gait velocity: decr Gait velocity interpretation: Below normal speed for age/gender General Gait Details: cues for posture, position from RW, and initial sequence   Stairs            Wheelchair Mobility    Modified Rankin (Stroke Patients Only)       Balance Overall balance assessment: No apparent balance deficits (not formally assessed)                                          Cognition Arousal/Alertness: Awake/alert Behavior During Therapy: WFL for tasks assessed/performed Overall Cognitive  Status: Within Functional Limits for tasks assessed                                        Exercises      General Comments        Pertinent Vitals/Pain Pain Assessment: 0-10 Pain Score: 6  Pain Location: R hip/thigh Pain Descriptors / Indicators: Aching;Sore Pain Intervention(s): Limited activity within patient's tolerance;Monitored during session;Premedicated before session;Patient requesting pain meds-RN notified;Ice applied    Home Living Family/patient expects to be discharged to:: Private residence Living Arrangements: Spouse/significant other Available Help at Discharge: Family;Available 24 hours/day Type of Home: House     Home Layout: One level Home Equipment: Bedside commode      Prior Function Level of Independence: Independent          PT Goals (current goals can now be found in the care plan section) Acute Rehab PT Goals Patient Stated Goal: Regain IND and walk without pain PT Goal Formulation: With patient Time For Goal Achievement: 11/24/16 Potential to Achieve Goals: Good Progress towards PT goals: Progressing toward goals    Frequency    7X/week      PT Plan Current plan remains appropriate    Co-evaluation             End of Session   Activity Tolerance: Patient tolerated treatment well Patient left: in bed;with call bell/phone within reach;with family/visitor  present Nurse Communication: Mobility status PT Visit Diagnosis: Unsteadiness on feet (R26.81);Difficulty in walking, not elsewhere classified (R26.2)     Time: 0600-4599 PT Time Calculation (min) (ACUTE ONLY): 22 min  Charges:  $Gait Training: 8-22 mins                    G Codes:       Pg [phone]    Keysi Oelkers 11/20/2016, 3:17 PM

## 2016-11-20 NOTE — Progress Notes (Signed)
Physical Therapy Treatment Patient Details Name: Scott Avery MRN: [id number] DOB: [birth year] Today's Date: 11/20/2016    History of Present Illness Pt s/p R THR and with hx of DM    PT Comments    Pt progressing well with mobility and hopeful for dc home tomorrow.   Follow Up Recommendations  Home health PT     Equipment Recommendations  Rolling walker with 5" wheels    Recommendations for Other Services OT consult     Precautions / Restrictions Precautions Precautions: Fall Restrictions Weight Bearing Restrictions: No Other Position/Activity Restrictions: WBAT    Mobility  Bed Mobility               General bed mobility comments: NT - Pt up in chair and requests back to same  Transfers Overall transfer level: Needs assistance Equipment used: Rolling walker (2 wheeled) Transfers: Sit to/from Stand Sit to Stand: Min assist         General transfer comment: cues for LE management and use of UEs to self assist  Ambulation/Gait Ambulation/Gait assistance: Min assist;Min guard Ambulation Distance (Feet): 200 Feet Assistive device: Rolling walker (2 wheeled) Gait Pattern/deviations: Decreased step length - right;Decreased step length - left;Shuffle;Trunk flexed;Step-to pattern;Step-through pattern Gait velocity: decr Gait velocity interpretation: Below normal speed for age/gender General Gait Details: cues for posture, position from RW, and initial sequence   Stairs            Wheelchair Mobility    Modified Rankin (Stroke Patients Only)       Balance Overall balance assessment: No apparent balance deficits (not formally assessed)                                          Cognition Arousal/Alertness: Awake/alert Behavior During Therapy: WFL for tasks assessed/performed Overall Cognitive Status: Within Functional Limits for tasks assessed                                        Exercises Total Joint  Exercises Ankle Circles/Pumps: AROM;Both;15 reps;Supine Quad Sets: AROM;Both;10 reps;Supine Heel Slides: AAROM;Right;20 reps;Supine Hip ABduction/ADduction: AAROM;Right;15 reps;Supine    General Comments        Pertinent Vitals/Pain Pain Assessment: 0-10 Pain Score: 2  Pain Location: R hip/thigh Pain Descriptors / Indicators: Burning;Aching Pain Intervention(s): Limited activity within patient's tolerance;Monitored during session;Premedicated before session;Ice applied    Home Living                      Prior Function            PT Goals (current goals can now be found in the care plan section) Acute Rehab PT Goals Patient Stated Goal: Regain IND and walk without pain PT Goal Formulation: With patient Time For Goal Achievement: 11/24/16 Potential to Achieve Goals: Good Progress towards PT goals: Progressing toward goals    Frequency    7X/week      PT Plan Current plan remains appropriate    Co-evaluation             End of Session   Activity Tolerance: Patient tolerated treatment well Patient left: in chair;with call bell/phone within reach;with chair alarm set;with family/visitor present Nurse Communication: Mobility status PT Visit Diagnosis: Unsteadiness on feet (R26.81);Difficulty in walking, not elsewhere classified (  R26.2)     Time: 5697-9480 PT Time Calculation (min) (ACUTE ONLY): 31 min  Charges:  $Gait Training: 8-22 mins $Therapeutic Exercise: 8-22 mins                    G Codes:       Pg [phone]    Zanita Millman 11/20/2016, 11:18 AM

## 2016-11-21 LAB — GLUCOSE, CAPILLARY
GLUCOSE-CAPILLARY: 141 mg/dL — AB (ref 65–99)
GLUCOSE-CAPILLARY: 133 mg/dL — AB (ref 65–99)
Glucose-Capillary: 128 mg/dL — ABNORMAL HIGH (ref 65–99)

## 2016-11-21 NOTE — Progress Notes (Signed)
Patient resting comfortably on CPAP via [facility] (home mask & tubing) previous settings of 13.5 cm H20.

## 2016-11-21 NOTE — Progress Notes (Addendum)
   Subjective: 2 Days Post-Op Procedure(s) (LRB): RIGHT TOTAL HIP ARTHROPLASTY ANTERIOR APPROACH (Right) Patient reports pain as mild.    Objective: Vital signs in last 24 hours: Temp:  [98.3 F (36.8 C)-99.7 F (37.6 C)] 98.3 F (36.8 C) (04/29 0519) Pulse Rate:  [76-81] 78 (04/29 0519) Resp:  [15-18] 18 (04/29 0519) BP: (126-155)/(64-81) 155/69 (04/29 0519) SpO2:  [95 %-97 %] 96 % (04/29 0519)  Intake/Output from previous day: 04/28 0701 - 04/29 0700 In: 1370 [P.O.:960; I.V.:410] Out: 2325 [Urine:2325] Intake/Output this shift: No intake/output data recorded.   Recent Labs  11/20/16 0458  HGB 11.7*    Recent Labs  11/20/16 0458  WBC 8.2  RBC 3.97*  HCT 35.0*  PLT 171    Recent Labs  11/20/16 0458  NA 135  K 3.7  CL 100*  CO2 26  BUN 17  CREATININE 0.90  GLUCOSE 142*  CALCIUM 8.3*   No results for input(s): LABPT, INR in the last 72 hours.  Neurologically intact No results found.  Assessment/Plan: 2 Days Post-Op Procedure(s) (LRB): RIGHT TOTAL HIP ARTHROPLASTY ANTERIOR APPROACH (Right) Up with therapy.  Has not done stairs . Home in AM. Rx on chart. He wants to go home in AM to get extra practice walking and stairs.   Marybelle Killings 11/21/2016, 8:26 AM

## 2016-11-21 NOTE — Progress Notes (Signed)
Physical Therapy Treatment Patient Details Name: TAVARUS POTEETE MRN: [id number] DOB: 06/29/1966 Today's Date: 11/21/2016    History of Present Illness Pt s/p R THR and with hx of DM    PT Comments    POD # 2 am session Assisted with amb in hallway then practiced going up 2 steps backward(no rails) with walker.  Performed well.  Will need another PT session.  Unsure if he will D/C today or tomorrow(pain control).    Follow Up Recommendations  Home health PT     Equipment Recommendations  Rolling walker with 5" wheels (bari)    Recommendations for Other Services       Precautions / Restrictions Precautions Precautions: Fall Restrictions Weight Bearing Restrictions: No Other Position/Activity Restrictions: WBAT    Mobility  Bed Mobility               General bed mobility comments: OOB in recliner  Transfers Overall transfer level: Needs assistance Equipment used: Rolling walker (2 wheeled) Transfers: Sit to/from Stand Sit to Stand: Supervision         General transfer comment: verbal cues for LE management and use of UEs to self assist  Ambulation/Gait Ambulation/Gait assistance: Min guard;Supervision Ambulation Distance (Feet): 75 Feet Assistive device: Rolling walker (2 wheeled) Gait Pattern/deviations: Decreased step length - right;Decreased step length - left;Shuffle;Trunk flexed;Step-to pattern;Step-through pattern Gait velocity: decr   General Gait Details: cues for posture, position from RW, and initial sequence   Stairs Stairs: Yes   Stair Management: No rails;Step to pattern;Backwards Number of Stairs: 2 General stair comments: 50% VC's on proper walker placement and sequencing.  Up backward due to NO RAILS.   Wheelchair Mobility    Modified Rankin (Stroke Patients Only)       Balance                                            Cognition Arousal/Alertness: Awake/alert Behavior During Therapy: WFL for tasks  assessed/performed Overall Cognitive Status: Within Functional Limits for tasks assessed                                        Exercises      General Comments        Pertinent Vitals/Pain Pain Assessment: No/denies pain Pain Score: 5  Pain Location: R hip/thigh Pain Descriptors / Indicators: Aching;Sore;Operative site guarding Pain Intervention(s): Monitored during session;Repositioned;Ice applied    Home Living                      Prior Function            PT Goals (current goals can now be found in the care plan section) Progress towards PT goals: Progressing toward goals    Frequency    7X/week      PT Plan Current plan remains appropriate    Co-evaluation             End of Session Equipment Utilized During Treatment: Gait belt Activity Tolerance: Patient tolerated treatment well Patient left: in chair;with call bell/phone within reach Nurse Communication: Mobility status PT Visit Diagnosis: Unsteadiness on feet (R26.81);Difficulty in walking, not elsewhere classified (R26.2)     Time: 1443-1540 PT Time Calculation (min) (ACUTE ONLY): 25 min  Charges:  $Gait  Training: 8-22 mins $Therapeutic Activity: 8-22 mins                    G Codes:       Rica Koyanagi  PTA [facility]  Acute  Rehab Pager      [phone]

## 2016-11-21 NOTE — Progress Notes (Signed)
Physical Therapy Treatment Patient Details Name: Scott Avery MRN: [id number] DOB: 11-Aug-1965 Today's Date: 11/21/2016    History of Present Illness Pt s/p R THR and with hx of DM    PT Comments    POD # 2 pm Tolerated an increased distance.  Max c/o "tightness"  Plans to D/C to home tomorrow.    Follow Up Recommendations  Home health PT     Equipment Recommendations  Rolling walker with 5" wheels (bari)    Recommendations for Other Services       Precautions / Restrictions Precautions Precautions: Fall Restrictions Weight Bearing Restrictions: No Other Position/Activity Restrictions: WBAT    Mobility  Bed Mobility               General bed mobility comments: OOB in recliner  Transfers Overall transfer level: Needs assistance Equipment used: Rolling walker (2 wheeled) Transfers: Sit to/from Stand Sit to Stand: Supervision         General transfer comment: verbal cues for LE management and use of UEs to self assist  Ambulation/Gait Ambulation/Gait assistance: Min guard;Supervision Ambulation Distance (Feet): 145 Feet Assistive device: Rolling walker (2 wheeled) Gait Pattern/deviations: Decreased step length - right;Decreased step length - left;Shuffle;Trunk flexed;Step-to pattern;Step-through pattern Gait velocity: decr   General Gait Details: amb a greater distance and also to bathroom   Stairs Wheelchair Mobility    Modified Rankin (Stroke Patients Only)       Balance                                            Cognition Arousal/Alertness: Awake/alert Behavior During Therapy: WFL for tasks assessed/performed Overall Cognitive Status: Within Functional Limits for tasks assessed                                        Exercises      General Comments        Pertinent Vitals/Pain Pain Assessment: No/denies pain Pain Score: 5  Pain Location: R hip/thigh Pain Descriptors / Indicators:  Aching;Sore;Operative site guarding Pain Intervention(s): Monitored during session;Repositioned;Ice applied    Home Living                      Prior Function            PT Goals (current goals can now be found in the care plan section) Progress towards PT goals: Progressing toward goals    Frequency    7X/week      PT Plan Current plan remains appropriate    Co-evaluation             End of Session Equipment Utilized During Treatment: Gait belt Activity Tolerance: Patient tolerated treatment well Patient left: in chair;with call bell/phone within reach Nurse Communication: Mobility status PT Visit Diagnosis: Unsteadiness on feet (R26.81);Difficulty in walking, not elsewhere classified (R26.2)     Time: 2706-2376 PT Time Calculation (min) (ACUTE ONLY): 20 min  Charges:  $Gait Training: 8-22 mins                    G Codes:       Rica Koyanagi  PTA [facility]  Acute  Rehab Pager      [phone]

## 2016-11-21 NOTE — Progress Notes (Signed)
Patient placed self on cpap. Patient tolerating well

## 2016-11-22 NOTE — Progress Notes (Signed)
Patient ID: Scott Avery, male   DOB: [birth year], 51 y.o.   MRN: [id number] Doing well overall.  Right hip stable.  Vitals stable.  Can be discharged to home today.

## 2016-11-22 NOTE — Progress Notes (Addendum)
Discharge planning, spoke with patient and spouse at bedside. Have chosen [facility] for HH PT. Contacted [facility] for referral. Needs RW, has 3-n-1, contacted [facility] to deliver to room. [phone] 

## 2016-11-22 NOTE — Op Note (Signed)
NAME:  Scott Avery, Scott Avery NO.:  MEDICAL RECORD NO.:  [id number]  LOCATION:                                 FACILITY:  PHYSICIAN:  Lind Guest. Ninfa Linden, M.D.DATE OF BIRTH:  DATE OF PROCEDURE:  11/19/2016 DATE OF DISCHARGE:                              OPERATIVE REPORT   PREOPERATIVE DIAGNOSIS:  Severe osteoarthritis and degenerative joint disease, right hip.  POSTOPERATIVE DIAGNOSIS:  Severe osteoarthritis and degenerative joint disease, right hip.  PROCEDURE:  Right total hip arthroplasty through direct anterior approach.  IMPLANTS:  DePuy Sector Gription acetabular component size 54, size 36+ 4 polyethylene liner, size 11 Corail femoral component with varus offset, size 36+ 8.5 ceramic hip ball.  SURGEON:  Lind Guest. Ninfa Linden, M.D.  ASSISTANT:  Erskine Emery, PA-C.  ANESTHESIA:  Spinal.  BLOOD LOSS:  400 mL.  ANTIBIOTICS:  3 g of IV Ancef.  COMPLICATIONS:  None.  INDICATIONS:  Scott Avery is a 51 year old morbidly obese and very pleasant individual with debilitating arthritis involving his right hip. His x-ray showed complete loss of the superolateral joint space.  There are sclerotic changes and periarticular osteophytes.  His pain is daily and has detrimentally affected his activities of daily living, his quality of life, his mobility.  At this point with the failure of all forms of conservative treatment, he does wish to proceed with a total hip arthroplasty.  He understands the height and risk of acute blood loss anemia, nerve and vessel injury, fracture, infection, dislocation, DVT and implant failure given his obesity, but also the normal risks of total hip replacement surgery.  He understands our goals are decreased pain, improved mobility and overall improved quality of life.  PROCEDURE DESCRIPTION:  After informed consent was obtained, appropriate right hip was marked.  He was brought to the operating room where  spinal anesthesia was obtained while he was on the stretcher.  He was then laid in a supine position.  A Foley catheter was placed and both feet had traction boots applied to them.  Next, he was placed supine on the Hana fracture table with the perineal post in place and both legs in inline skeletal traction devices, but no traction applied.  His right operative hip was prepped and draped with DuraPrep and sterile drapes.  A time-out was called and he was identified as correct patient and correct right hip.  I then made an incision inferior and posterior to the anterior superior iliac spine and carried this obliquely down the leg.  We dissected down the tensor fascia lata and the tensor fascia was then divided longitudinally to proceed with a direct anterior approach to the hip.  This was quite difficult given his size and obesity, but we were able to identify the femoral neck and placed Cobra retractors on the medial and lateral femoral neck and we also cauterized the circumflex vessels.  We then opened up the hip capsule in L-type format finding significant arthritis in his hip.  We placed Cobra retractors deep within the capsule around the medial and lateral femoral neck and made our femoral neck cut with an oscillating saw proximal to the lesser trochanter and completed  this with an osteotome.  I placed a corkscrew guide in the femoral head and removed the femoral head in its entirety and found it to be completely devoid of cartilage.  There were severe sclerotic changes.  There was hard as a rock and shiny like marble.  We then cleaned the acetabulum and remnants of the acetabular labrum and other debris.  I put a bent Hohmann over the medial acetabular rim and then began reaming under direct visualization from a size 43 reamer up to a size 54.  The last reamer was also placed under direct fluoroscopy, so we could obtain our depth of reaming, our inclination and anteversion.  Once we  were pleased with this, we placed the real DePuy Sector Gription acetabular component size 54 and a 36+ 4 polyethylene liner.  Attention was then turned to the femur.  With the leg externally rotated to 120 degrees, extended and adducted, we were able to release the lateral joint capsule and used a box-cutting osteotome to enter the femoral canal and a rongeur to lateralize.  We placed a Mueller retractor medially and a Hohmann retractor behind the greater trochanter.  We then began broaching from a size 8 broach using Corail broaching system up to a size 11.  I wanted to go higher to a size 12, but it was hard to get him back because of the bowing of his femur.  I tried to lateralize as much as possible.  We then trialed a varus-offset femoral neck and a 36+ 1.5 hip ball, rolled leg back over and up with traction and internal rotation reducing the pelvis and it was definitely unstable and short.  Of note, preoperatively, he started off significantly short.  We dislocated the hip and removed the trial components.  We then placed the real Corail femoral component with varus offset size 11 and the real 36+ 8.5 hip ball, reduced this in the acetabulum and I was pleased with stability on this, go and the fact that we had to lengthen him as well.  I felt a nice and tight with increased offset as well.  We then irrigated the soft tissue with normal saline solution.  I was able to put few sutures in the joint capsule, closing this with #1 Ethibond suture followed by 0 Vicryl in the deep tissue, running #1 Vicryl over the tensor fascia, 0 Vicryl in the deep tissue, 2-0 Vicryl in the subcutaneous tissue and interrupted staples on the skin.  Xeroform and Aquacel dressing were applied.  He was slowly taken off the Hana table and taken to the recovery room in stable condition.  All final counts were correct.  There were no complications noted.  Of note, Erskine Emery, PA-C, assisted in the entire  case.  His assistance was crucial for facilitating all aspects of this case.     Lind Guest. Ninfa Linden, M.D.     CYB/MEDQ  D:  11/19/2016  T:  11/20/2016  Job:  [id number]

## 2016-11-22 NOTE — Progress Notes (Signed)
Physical Therapy Treatment Patient Details Name: Scott Avery MRN: [id number] DOB: Feb 01, 1966 Today's Date: 11/22/2016    History of Present Illness Pt s/p R THR and with hx of DM    PT Comments    POD # 3 Assisted with amb in hallway, practiced stairs.  Pt ready for D/C to home.     Follow Up Recommendations  Home health PT     Equipment Recommendations  Rolling walker with 5" wheels    Recommendations for Other Services       Precautions / Restrictions Precautions Precautions: Fall Restrictions Weight Bearing Restrictions: No Other Position/Activity Restrictions: WBAT    Mobility  Bed Mobility               General bed mobility comments: OOB in recliner  Transfers Overall transfer level: Needs assistance Equipment used: Rolling walker (2 wheeled) Transfers: Sit to/from Stand Sit to Stand: Supervision         General transfer comment: verbal cues for LE management and use of UEs to self assist  Ambulation/Gait Ambulation/Gait assistance: Supervision Ambulation Distance (Feet): 120 Feet Assistive device: Rolling walker (2 wheeled) Gait Pattern/deviations: Decreased step length - right;Decreased step length - left;Shuffle;Trunk flexed;Step-to pattern;Step-through pattern     General Gait Details: good safety cognition   Stairs Stairs: Yes   Stair Management: No rails;Step to pattern;Backwards Number of Stairs: 2 General stair comments: 25% VC's on proper walker placement and sequencing.  Up backward due to NO RAILS.   Wheelchair Mobility    Modified Rankin (Stroke Patients Only)       Balance                                            Cognition Arousal/Alertness: Awake/alert Behavior During Therapy: WFL for tasks assessed/performed Overall Cognitive Status: Within Functional Limits for tasks assessed                                        Exercises      General Comments        Pertinent  Vitals/Pain Pain Assessment: 0-10 Pain Score: 4  Pain Location: R hip/thigh Pain Descriptors / Indicators: Aching;Sore;Operative site guarding Pain Intervention(s): Monitored during session;Repositioned;Ice applied    Home Living                      Prior Function            PT Goals (current goals can now be found in the care plan section) Progress towards PT goals: Progressing toward goals    Frequency    7X/week      PT Plan Current plan remains appropriate    Co-evaluation              AM-PAC PT "6 Clicks" Daily Activity  Outcome Measure  Difficulty turning over in bed (including adjusting bedclothes, sheets and blankets)?: A Little Difficulty moving from lying on back to sitting on the side of the bed? : A Little Difficulty sitting down on and standing up from a chair with arms (e.g., wheelchair, bedside commode, etc,.)?: A Little Help needed moving to and from a bed to chair (including a wheelchair)?: A Little Help needed walking in hospital room?: A Little Help needed climbing 3-5 steps with  a railing? : A Little 6 Click Score: 18    End of Session Equipment Utilized During Treatment: Gait belt Activity Tolerance: Patient tolerated treatment well Patient left: in chair;with call bell/phone within reach Nurse Communication:  (pt ready for D/C to home) PT Visit Diagnosis: Unsteadiness on feet (R26.81);Difficulty in walking, not elsewhere classified (R26.2)     Time: 0940-1006 PT Time Calculation (min) (ACUTE ONLY): 26 min  Charges:  $Gait Training: 8-22 mins $Therapeutic Exercise: 8-22 mins                    G Codes:       Rica Koyanagi  PTA [facility]  Acute  Rehab Pager      [phone]

## 2016-11-22 NOTE — Discharge Summary (Signed)
Patient ID: Scott Avery MRN: [id number] DOB/AGE: 11-10-1965 51 y.o.  Admit date: 11/19/2016 Discharge date: 11/22/2016  Admission Diagnoses:  Principal Problem:   Unilateral primary osteoarthritis, right hip Active Problems:   Status post total replacement of right hip   Discharge Diagnoses:  Same  Past Medical History:  Diagnosis Date  . Arthritis    "hands; right hip" (06/21/2014)  . Childhood asthma   . Gastroenteritis   . GERD (gastroesophageal reflux disease)   . Hypertension   . Hypertriglyceridemia   . Morbid obesity ([place])   . OSA on CPAP   . SBO (small bowel obstruction) ([place]) 05/2014  . Type II diabetes mellitus ([place]) dx'd ~ 03/2014   diet controlled     Surgeries: Procedure(s): RIGHT TOTAL HIP ARTHROPLASTY ANTERIOR APPROACH on 11/19/2016   Consultants:   Discharged Condition: Improved  Hospital Course: DEVONTE MIGUES is an 51 y.o. male who was admitted 11/19/2016 for operative treatment ofUnilateral primary osteoarthritis, right hip. Patient has severe unremitting pain that affects sleep, daily activities, and work/hobbies. After pre-op clearance the patient was taken to the operating room on 11/19/2016 and underwent  Procedure(s): RIGHT TOTAL HIP ARTHROPLASTY ANTERIOR APPROACH.    Patient was given perioperative antibiotics: Anti-infectives    Start     Dose/Rate Route Frequency Ordered Stop   11/19/16 1500  ceFAZolin (ANCEF) IVPB 2g/100 mL premix     2 g 200 mL/hr over 30 Minutes Intravenous Every 6 hours 11/19/16 1322 11/19/16 2204   11/19/16 0600  ceFAZolin (ANCEF) 3 g in dextrose 5 % 50 mL IVPB     3 g 130 mL/hr over 30 Minutes Intravenous On call to O.R. 11/18/16 1220 11/19/16 0916       Patient was given sequential compression devices, early ambulation, and chemoprophylaxis to prevent DVT.  Patient benefited maximally from hospital stay and there were no complications.    Recent vital signs: Patient Vitals for the past 24 hrs:  BP Temp Temp  src Pulse Resp SpO2  11/22/16 0627 115/67 98.1 F (36.7 C) Oral 71 18 98 %  11/21/16 1357 120/65 99 F (37.2 C) Oral 83 17 97 %     Recent laboratory studies:  Recent Labs  11/20/16 0458  WBC 8.2  HGB 11.7*  HCT 35.0*  PLT 171  NA 135  K 3.7  CL 100*  CO2 26  BUN 17  CREATININE 0.90  GLUCOSE 142*  CALCIUM 8.3*     Discharge Medications:   Allergies as of 11/22/2016   No Known Allergies     Medication List    STOP taking these medications   ibuprofen 200 MG tablet Commonly known as:  ADVIL,MOTRIN   meloxicam 15 MG tablet Commonly known as:  MOBIC   traMADol 50 MG tablet Commonly known as:  ULTRAM     TAKE these medications   amLODipine 5 MG tablet Commonly known as:  NORVASC TAKE 1 TABLET BY MOUTH EVERY DAY   aspirin 81 MG chewable tablet Chew 1 tablet (81 mg total) by mouth 2 (two) times daily.   gabapentin 100 MG capsule Commonly known as:  NEURONTIN Take 2 capsules (200 mg total) by mouth at bedtime. What changed:  when to take this  reasons to take this   ipratropium 0.03 % nasal spray Commonly known as:  ATROVENT Place 2 sprays into both nostrils 2 (two) times daily.   labetalol 300 MG tablet Commonly known as:  NORMODYNE Take 1 tablet (300 mg total) by mouth  2 (two) times daily. -- Office visit needed for further refills   losartan 100 MG tablet Commonly known as:  COZAAR TAKE 1 TABLET BY MOUTH EVERY DAY   methocarbamol 500 MG tablet Commonly known as:  ROBAXIN Take 1 tablet (500 mg total) by mouth every 6 (six) hours as needed for muscle spasms.   omeprazole 20 MG capsule Commonly known as:  PRILOSEC Take 20 mg by mouth daily.   oxyCODONE-acetaminophen 5-325 MG tablet Commonly known as:  ROXICET Take 1-2 tablets by mouth every 4 (four) hours as needed.   promethazine 25 MG tablet Commonly known as:  PHENERGAN Take 1 tablet (25 mg total) by mouth every 8 (eight) hours as needed for nausea or vomiting.   Vitamin D  (Ergocalciferol) 50000 units Caps capsule Commonly known as:  DRISDOL TAKE 1 CAPSULE (50,000 UNITS TOTAL) BY MOUTH EVERY 7 (SEVEN) DAYS.            Durable Medical Equipment        Start     Ordered   11/19/16 1323  DME 3 n 1  Once     11/19/16 1322   11/19/16 1323  DME Walker rolling  Once    Question:  Patient needs a walker to treat with the following condition  Answer:  Status post total replacement of right hip   11/19/16 1322      Diagnostic Studies: Dg Pelvis Portable  Result Date: 11/19/2016 CLINICAL DATA:  Right hip surgery. EXAM: PORTABLE PELVIS 1-2 VIEWS COMPARISON:  04/06/2016 FINDINGS: There is now a right hip arthroplasty. Femoral stem is completely visualized. No evidence for a periprosthetic fracture. The right hip replacement appears located on these AP portable views. Few dilated loops of small bowel in the abdomen are nonspecific but could be related to an ileus. Pelvic bony ring is intact. Joint space narrowing and subchondral sclerosis in the medial left hip joint. Large phlebolith in the left hemipelvis. IMPRESSION: Right hip arthroplasty without complicating features. Dilated loops of small bowel as described. Osteoarthritic changes in the left hip. Electronically Signed   By: Markus Daft M.D.   On: 11/19/2016 12:14   Dg C-arm 61-120 Min-no Report  Result Date: 11/19/2016 Fluoroscopy was utilized by the requesting physician.  No radiographic interpretation.   Dg Hip Operative Unilat W Or W/o Pelvis Right  Result Date: 11/19/2016 CLINICAL DATA:  Right hip replacements EXAM: OPERATIVE RIGHT HIP (WITH PELVIS IF PERFORMED) 2 VIEWS TECHNIQUE: Fluoroscopic spot image(s) were submitted for interpretation post-operatively. COMPARISON:  08/16/2016 FINDINGS: Changes of right hip replacement. Normal AP alignment. No hardware or bony complicating feature. IMPRESSION: Right hip replacement.  No visible complicating feature. Electronically Signed   By: Rolm Baptise M.D.   On:  11/19/2016 11:17    Disposition: 01-Home or Self Care  Discharge Instructions    Discharge patient    Complete by:  As directed    Discharge disposition:  01-Home or Self Care   Discharge patient date:  11/22/2016      Follow-up Information    Mcarthur Rossetti, MD Follow up in 2 week(s).   Specialty:  Orthopedic Surgery Contact information: [place] [address] [phone]            Signed: Mcarthur Rossetti 11/22/2016, 7:22 AM

## 2016-11-24 ENCOUNTER — Telehealth (INDEPENDENT_AMBULATORY_CARE_PROVIDER_SITE_OTHER): Payer: Self-pay | Admitting: Orthopaedic Surgery

## 2016-11-24 NOTE — Telephone Encounter (Signed)
Erin from [facility] called asking for verbal approval of 2 times a week for 2 weeks. CB # [phone]

## 2016-11-25 NOTE — Telephone Encounter (Signed)
Verbal order given  

## 2016-11-29 ENCOUNTER — Other Ambulatory Visit: Payer: Self-pay | Admitting: Internal Medicine

## 2016-12-02 ENCOUNTER — Ambulatory Visit (INDEPENDENT_AMBULATORY_CARE_PROVIDER_SITE_OTHER): Payer: BLUE CROSS/BLUE SHIELD | Admitting: Orthopaedic Surgery

## 2016-12-02 DIAGNOSIS — Z96641 Presence of right artificial hip joint: Secondary | ICD-10-CM

## 2016-12-02 NOTE — Progress Notes (Signed)
The patient be 2 weeks tomorrow status post a right total hip arthroplasty. He is ambulating with a crutch and is doing well overall. On examination he does have a moderate seroma and I drained 60 mL of fluid from the hip and this made him feel significantly better. There is no evidence infection all. I was able to remove all his staples and placed Steri-Strips. His leg lengths are equal.   At this point he'll go down to one baby aspirin daily for one more week and then can stop that. He can always call and come pick up more pain medicines if he needs some. He is cleared drive once he is off pain medication. He'll still stat work until further notice. We'll see him back for reevaluation 4 weeks from now. No x-rays are needed

## 2016-12-06 ENCOUNTER — Telehealth (INDEPENDENT_AMBULATORY_CARE_PROVIDER_SITE_OTHER): Payer: Self-pay | Admitting: Orthopaedic Surgery

## 2016-12-06 NOTE — Telephone Encounter (Signed)
Verbal order given  

## 2016-12-06 NOTE — Telephone Encounter (Signed)
Erin with [facility] called needing verbal orders for 1 wk 1 for (PT) to discharge patient. Missed visit on 12/03/16

## 2016-12-30 ENCOUNTER — Ambulatory Visit (INDEPENDENT_AMBULATORY_CARE_PROVIDER_SITE_OTHER): Payer: BLUE CROSS/BLUE SHIELD | Admitting: Orthopaedic Surgery

## 2016-12-30 DIAGNOSIS — Z96641 Presence of right artificial hip joint: Secondary | ICD-10-CM

## 2016-12-30 NOTE — Progress Notes (Signed)
The patient is now 6 weeks status post right total hip arthroplasty through direct injury approach. 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 He's been making good progress but having appropriate pain postoperative for someone who is only 51 years old.  On examination he does still have stiffness of his muscles around his incision and somewhat of a hematoma but no significant seroma at all. His ligaments are equal. He is still has significant pain around his knee and his hip from the surgery itself and this is to be expected for we'll put him through. He is walking without assistive device and doing well and not really taking a lot of pain medications at all.  I like to reevaluate in 4 weeks to see how he is doing overall. Continue to increase his activities slowly and as comfort allows. Hopefully when I see him back in a month we can get a better idea of when to allow him to go back to work full activities.

## 2017-01-10 ENCOUNTER — Telehealth: Payer: Self-pay | Admitting: Internal Medicine

## 2017-01-10 MED ORDER — LOSARTAN POTASSIUM 100 MG PO TABS: 100.0000 mg | ORAL_TABLET | Freq: Every day | ORAL | 0 refills | Status: DC

## 2017-01-10 NOTE — Telephone Encounter (Signed)
Patient is requesting a refill on this mediation. He states he is recovering from a hip surgery and is out of town due to this some that family could help him. He states he could plan to come in at the end of July. Please advise.   [facility]  [address] [place], [place] [address]  Contact number [phone]

## 2017-01-10 NOTE — Addendum Note (Signed)
Addended by: Terence Lux B on: 01/10/2017 03:02 PM   Modules accepted: Orders

## 2017-01-10 NOTE — Telephone Encounter (Signed)
RX sent to pharmacy given, please contact pt back to schedule appt. Before any more refills can be sent in.

## 2017-01-11 NOTE — Telephone Encounter (Signed)
LVM to inform pt of rx sent. &He needs to call the office to make an appointment to get any more refills. When I spoke with him yesterday I know he informed he would set up an appointment once he followed up with his surgeon.

## 2017-01-31 ENCOUNTER — Ambulatory Visit (INDEPENDENT_AMBULATORY_CARE_PROVIDER_SITE_OTHER): Payer: BLUE CROSS/BLUE SHIELD | Admitting: Orthopaedic Surgery

## 2017-01-31 DIAGNOSIS — Z96641 Presence of right artificial hip joint: Secondary | ICD-10-CM

## 2017-01-31 NOTE — Progress Notes (Signed)
Scott Avery is following up at just under 3 months status post a right total hip arthroplasty. His surgery was on 11/19/2016. He is doing well overall. He's been working on balance and coordination as well as strengthening of his hip. 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  On examination he has excellent range of motion of his right hip but still some hip and knee pain to be expected. His strength is improving but is not full yet.  At this point I do not need to see him back for 6 months. At that visit I would like an AP pelvis and lateral of his right operative hip. From my standpoint I do need to keep him out of work just a little bit longer as he strengthening his hip. I will allow him to return to full work duties without restrictions starting 02/28/2017.

## 2017-02-03 ENCOUNTER — Telehealth: Payer: Self-pay | Admitting: Internal Medicine

## 2017-02-04 MED ORDER — AMLODIPINE BESYLATE 5 MG PO TABS: 5.0000 mg | ORAL_TABLET | Freq: Every day | ORAL | 0 refills | Status: DC

## 2017-02-04 NOTE — Telephone Encounter (Signed)
Pt needs a refill of amLODipine (NORVASC) 5 MG tablet,  Appt made for 7/31, he has 4 pills left, he has recently had a hip replaced so I did not make a CPE appt. He is going back to [employer] on 8/6. He will be moving in a few months to [place], if meds [facility] cannot [facility] [address] [facility] [place] he will get it transferred to a [facility] because he will be going back to [employer]

## 2017-02-04 NOTE — Addendum Note (Signed)
Addended by: Terence Lux B on: 02/04/2017 10:49 AM   Modules accepted: Orders

## 2017-02-07 ENCOUNTER — Other Ambulatory Visit: Payer: Self-pay | Admitting: Internal Medicine

## 2017-02-21 ENCOUNTER — Ambulatory Visit (INDEPENDENT_AMBULATORY_CARE_PROVIDER_SITE_OTHER): Payer: BLUE CROSS/BLUE SHIELD | Admitting: Orthopaedic Surgery

## 2017-02-21 DIAGNOSIS — Z96641 Presence of right artificial hip joint: Secondary | ICD-10-CM

## 2017-02-21 NOTE — Progress Notes (Signed)
The patient is now just over 3 months status post a right total hip arthroplasty. He seen today for a fit for duty note to allow him to return to full work duties without restrictions. 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  On examination today he has excellent and full strength to bilateral upper and lower extremity is. He can bend easily and the waist and knees. He can squat easily as well. He demonstrates the ability to function fully as described to his work prescription that he provided me.  At this point I'll need see him back in 3-6 months. We'll have a low AP pelvis and lateral of his right operative hip at that visit. All questions were encouraged and answered.

## 2017-02-22 ENCOUNTER — Ambulatory Visit: Payer: BLUE CROSS/BLUE SHIELD | Admitting: Internal Medicine

## 2017-03-06 ENCOUNTER — Other Ambulatory Visit: Payer: Self-pay | Admitting: Internal Medicine

## 2017-03-07 MED ORDER — LABETALOL HCL 300 MG PO TABS: 300.0000 mg | ORAL_TABLET | Freq: Two times a day (BID) | ORAL | 0 refills | Status: DC

## 2017-03-14 ENCOUNTER — Ambulatory Visit (INDEPENDENT_AMBULATORY_CARE_PROVIDER_SITE_OTHER): Payer: BLUE CROSS/BLUE SHIELD | Admitting: Urgent Care

## 2017-03-14 ENCOUNTER — Encounter: Payer: Self-pay | Admitting: Urgent Care

## 2017-03-14 VITALS — BP 134/84 | HR 74 | Temp 98.1°F | Resp 18 | Ht 72.0 in | Wt 355.4 lb

## 2017-03-14 DIAGNOSIS — R5383 Other fatigue: Secondary | ICD-10-CM | POA: Diagnosis not present

## 2017-03-14 DIAGNOSIS — R42 Dizziness and giddiness: Secondary | ICD-10-CM

## 2017-03-14 DIAGNOSIS — L089 Local infection of the skin and subcutaneous tissue, unspecified: Secondary | ICD-10-CM | POA: Insufficient documentation

## 2017-03-14 MED ORDER — DOXYCYCLINE HYCLATE 100 MG PO CAPS: 100.0000 mg | ORAL_CAPSULE | Freq: Two times a day (BID) | ORAL | 0 refills | Status: DC

## 2017-03-14 NOTE — Patient Instructions (Addendum)
We will manage this as a superficial skin infection of the scalp. Please take the antibiotic with food twice daily. Use warm compresses for 15-20 minutes 2-3 times daily. Stay out of the sun while taking this antibiotic.    Doxycycline tablets or capsules What is this medicine? DOXYCYCLINE (dox i SYE kleen) is a tetracycline antibiotic. It kills certain bacteria or stops their growth. It is used to treat many kinds of infections, like dental, skin, respiratory, and urinary tract infections. It also treats acne, Lyme disease, malaria, and certain sexually transmitted infections. This medicine may be used for other purposes; ask your health care provider or pharmacist if you have questions. COMMON BRAND NAME(S): Acticlate, Adoxa, Adoxa CK, Adoxa Pak, Adoxa TT, Alodox, Avidoxy, Doxal, Mondoxyne NL, Monodox, Morgidox 1x, Morgidox 1x Kit, Morgidox 2x, Morgidox 2x Kit, NutriDox, Ocudox, TARGADOX, Vibra-Tabs, Vibramycin What should I tell my health care provider before I take this medicine? They need to know if you have any of these conditions: -liver disease -long exposure to sunlight like working outdoors -stomach problems like colitis -an unusual or allergic reaction to doxycycline, tetracycline antibiotics, other medicines, foods, dyes, or preservatives -pregnant or trying to get pregnant -breast-feeding How should I use this medicine? Take this medicine by mouth with a full glass of water. Follow the directions on the prescription label. It is best to take this medicine without food, but if it upsets your stomach take it with food. Take your medicine at regular intervals. Do not take your medicine more often than directed. Take all of your medicine as directed even if you think you are better. Do not skip doses or stop your medicine early. Talk to your pediatrician regarding the use of this medicine in children. While this drug may be prescribed for selected conditions, precautions do  apply. Overdosage: If you think you have taken too much of this medicine contact a poison control center or emergency room at once. NOTE: This medicine is only for you. Do not share this medicine with others. What if I miss a dose? If you miss a dose, take it as soon as you can. If it is almost time for your next dose, take only that dose. Do not take double or extra doses. What may interact with this medicine? -antacids -barbiturates -birth control pills -bismuth subsalicylate -carbamazepine -methoxyflurane -other antibiotics -phenytoin -vitamins that contain iron -warfarin This list may not describe all possible interactions. Give your health care provider a list of all the medicines, herbs, non-prescription drugs, or dietary supplements you use. Also tell them if you smoke, drink alcohol, or use illegal drugs. Some items may interact with your medicine. What should I watch for while using this medicine? Tell your doctor or health care professional if your symptoms do not improve. Do not treat diarrhea with over the counter products. Contact your doctor if you have diarrhea that lasts more than 2 days or if it is severe and watery. Do not take this medicine just before going to bed. It may not dissolve properly when you lay down and can cause pain in your throat. Drink plenty of fluids while taking this medicine to also help reduce irritation in your throat. This medicine can make you more sensitive to the sun. Keep out of the sun. If you cannot avoid being in the sun, wear protective clothing and use sunscreen. Do not use sun lamps or tanning beds/booths. Birth control pills may not work properly while you are taking this medicine. Talk to your  doctor about using an extra method of birth control. If you are being treated for a sexually transmitted infection, avoid sexual contact until you have finished your treatment. Your sexual partner may also need treatment. Avoid antacids, aluminum,  calcium, magnesium, and iron products for 4 hours before and 2 hours after taking a dose of this medicine. If you are using this medicine to prevent malaria, you should still protect yourself from contact with mosquitos. Stay in screened-in areas, use mosquito nets, keep your body covered, and use an insect repellent. What side effects may I notice from receiving this medicine? Side effects that you should report to your doctor or health care professional as soon as possible: -allergic reactions like skin rash, itching or hives, swelling of the face, lips, or tongue -difficulty breathing -fever -itching in the rectal or genital area -pain on swallowing -redness, blistering, peeling or loosening of the skin, including inside the mouth -severe stomach pain or cramps -unusual bleeding or bruising -unusually weak or tired -yellowing of the eyes or skin Side effects that usually do not require medical attention (report to your doctor or health care professional if they continue or are bothersome): -diarrhea -loss of appetite -nausea, vomiting This list may not describe all possible side effects. Call your doctor for medical advice about side effects. You may report side effects to FDA at 1-800-FDA-1088. Where should I keep my medicine? Keep out of the reach of children. Store at room temperature, below 30 degrees C (86 degrees F). Protect from light. Keep container tightly closed. Throw away any unused medicine after the expiration date. Taking this medicine after the expiration date can make you seriously ill. NOTE: This sheet is a summary. It may not cover all possible information. If you have questions about this medicine, talk to your doctor, pharmacist, or health care provider.  2018 Elsevier/Gold Standard (2015-08-13 17:11:22)     IF you received an x-ray today, you will receive an invoice from [facility]. Please contact [facility] at [phone] with questions or  concerns regarding your invoice.   IF you received labwork today, you will receive an invoice from [facility]. Please contact [facility] at [phone] with questions or concerns regarding your invoice.   Our billing staff will not be able to assist you with questions regarding bills from these companies.  You will be contacted with the lab results as soon as they are available. The fastest way to get your results is to activate your My Chart account. Instructions are located on the last page of this paperwork. If you have not heard from [place] regarding the results in 2 weeks, please contact this office.

## 2017-03-14 NOTE — Progress Notes (Signed)
    MRN: [id number] DOB: 07/20/1966  Subjective:   Scott Avery is a 51 y.o. male presenting for chief complaint of Head Injury (no known injury but left side of back of head is swollen and hurts to turn head since last night ); Dizziness (was dizzy last night ); and Fatigue  Reports 1 week history of painful knot over left side of his head. Has not developed some dizziness, left ear discomfort, fatigue. Pain is worsened with moving his head upwards. Has longstanding tinnitus which is unchanged. Has tried APAP this morning and does so consistently from his hip surgery, Apirl 27, 2018. Of note, patient admits that he got a haircut just prior to this, there was no incident that he can recall but has had this issue since then. Denies fever, sinus pain, sinus congestion, ear drainage, sore throat, cough. Smokes <1/4ppd. He is working on quitting smoking.   Jw has a current medication list which includes the following prescription(s): amlodipine, aspirin, labetalol, losartan, and omeprazole. Also has No Known Allergies.  Arlind  has a past medical history of Arthritis; Childhood asthma; Gastroenteritis; GERD (gastroesophageal reflux disease); Hypertension; Hypertriglyceridemia; Morbid obesity ([place]); OSA on CPAP; SBO (small bowel obstruction) ([place]) (05/2014); and Type II diabetes mellitus ([place]) (dx'd ~ 03/2014). Also  has a past surgical history that includes Lipoma excision (Left, ~ 2010); Wisdom tooth extraction; Hernia repair; Laparoscopic incisional / umbilical / ventral hernia repair (~ 2007); Laparoscopic cholecystectomy (2005); and Total hip arthroplasty (Right, 11/19/2016).  Objective:   Vitals: BP 134/84   Pulse 74   Temp 98.1 F (36.7 C) (Oral)   Resp 18   Ht 6' (1.829 m)   Wt (!) 355 lb 6.4 oz (161.2 kg)   SpO2 96%   BMI 48.20 kg/m   Physical Exam  Constitutional: He is oriented to person, place, and time. He appears well-developed and well-nourished.  HENT:  Head:    TM's  intact bilaterally, no effusions or erythema. Nasal turbinates pink and moist, nasal passages patent. No sinus tenderness. Oropharynx clear, mucous membranes moist.  Eyes: Right eye exhibits no discharge. Left eye exhibits no discharge.  Neck: Normal range of motion. Neck supple.  Cardiovascular: Normal rate.   Pulmonary/Chest: Effort normal.  Lymphadenopathy:    He has no cervical adenopathy.  Neurological: He is alert and oriented to person, place, and time.  Skin: Skin is warm and dry.   Assessment and Plan :   1. Superficial skin infection 2. Infection of scalp 3. Dizziness 4. Other fatigue - Will manage as infectious process with doxycycline. His swelling is likely due to lymphadenopathy secondary to infection. Patient is to use warm compresses over these areas. Return-to-clinic precautions discussed, patient verbalized understanding. Use supportive care otherwise.  Jaynee Eagles, PA-C [facility] [phone] 03/14/2017  11:48 AM

## 2017-03-23 ENCOUNTER — Other Ambulatory Visit (INDEPENDENT_AMBULATORY_CARE_PROVIDER_SITE_OTHER): Payer: BLUE CROSS/BLUE SHIELD

## 2017-03-23 ENCOUNTER — Encounter: Payer: Self-pay | Admitting: Internal Medicine

## 2017-03-23 ENCOUNTER — Ambulatory Visit (INDEPENDENT_AMBULATORY_CARE_PROVIDER_SITE_OTHER): Payer: BLUE CROSS/BLUE SHIELD | Admitting: Internal Medicine

## 2017-03-23 VITALS — BP 150/90 | HR 97 | Temp 97.8°F | Resp 16 | Ht 72.0 in | Wt 357.0 lb

## 2017-03-23 DIAGNOSIS — E781 Pure hyperglyceridemia: Secondary | ICD-10-CM | POA: Diagnosis not present

## 2017-03-23 DIAGNOSIS — K219 Gastro-esophageal reflux disease without esophagitis: Secondary | ICD-10-CM

## 2017-03-23 DIAGNOSIS — E1165 Type 2 diabetes mellitus with hyperglycemia: Secondary | ICD-10-CM

## 2017-03-23 DIAGNOSIS — I10 Essential (primary) hypertension: Secondary | ICD-10-CM | POA: Diagnosis not present

## 2017-03-23 LAB — CBC WITH DIFFERENTIAL/PLATELET
BASOS ABS: 0.1 10*3/uL (ref 0.0–0.1)
Basophils Relative: 0.7 % (ref 0.0–3.0)
Eosinophils Absolute: 0.3 10*3/uL (ref 0.0–0.7)
Eosinophils Relative: 3.9 % (ref 0.0–5.0)
HCT: 41.7 % (ref 39.0–52.0)
Hemoglobin: 14.1 g/dL (ref 13.0–17.0)
LYMPHS ABS: 2.9 10*3/uL (ref 0.7–4.0)
Lymphocytes Relative: 36 % (ref 12.0–46.0)
MCHC: 33.9 g/dL (ref 30.0–36.0)
MCV: 86.4 fl (ref 78.0–100.0)
MONO ABS: 0.6 10*3/uL (ref 0.1–1.0)
Monocytes Relative: 6.9 % (ref 3.0–12.0)
NEUTROS PCT: 52.5 % (ref 43.0–77.0)
Neutro Abs: 4.3 10*3/uL (ref 1.4–7.7)
Platelets: 213 10*3/uL (ref 150.0–400.0)
RBC: 4.83 Mil/uL (ref 4.22–5.81)
RDW: 13.8 % (ref 11.5–15.5)
WBC: 8.2 10*3/uL (ref 4.0–10.5)

## 2017-03-23 LAB — COMPREHENSIVE METABOLIC PANEL
ALK PHOS: 75 U/L (ref 39–117)
ALT: 40 U/L (ref 0–53)
AST: 25 U/L (ref 0–37)
Albumin: 4.2 g/dL (ref 3.5–5.2)
BILIRUBIN TOTAL: 0.4 mg/dL (ref 0.2–1.2)
BUN: 11 mg/dL (ref 6–23)
CO2: 29 mEq/L (ref 19–32)
Calcium: 9.1 mg/dL (ref 8.4–10.5)
Chloride: 102 mEq/L (ref 96–112)
Creatinine, Ser: 0.8 mg/dL (ref 0.40–1.50)
GFR: 109.06 mL/min (ref >60.00)
GLUCOSE: 109 mg/dL — AB (ref 70–99)
Potassium: 3.9 mEq/L (ref 3.5–5.1)
SODIUM: 136 meq/L (ref 135–145)
TOTAL PROTEIN: 7.1 g/dL (ref 6.0–8.3)

## 2017-03-23 LAB — LIPID PANEL
Cholesterol: 132 mg/dL (ref 0–200)
HDL: 28 mg/dL — AB (ref >39.00)
NONHDL: 103.99
TRIGLYCERIDES: 241 mg/dL — AB (ref 0.0–149.0)
Total CHOL/HDL Ratio: 5
VLDL: 48.2 mg/dL — AB (ref 0.0–40.0)

## 2017-03-23 LAB — LDL CHOLESTEROL, DIRECT: Direct LDL: 75 mg/dL

## 2017-03-23 LAB — HEMOGLOBIN A1C: Hgb A1c MFr Bld: 7.7 % — ABNORMAL HIGH (ref 4.6–6.5)

## 2017-03-23 NOTE — Progress Notes (Signed)
Subjective:    Patient ID: Scott Avery, male    DOB: Jan 04, 1966, 51 y.o.   MRN: [id number]  HPI The patient is here for follow up.  He had his right hip replaced since he was here last. He has some muscle pain, but no joint pain.    Hypertension: He is taking his medication daily. He is compliant with a low sodium diet.  He denies chest pain, palpitations, edema, shortness of breath and regular headaches. He is very active at work, but not exercising regularly.  He does monitor his blood pressure at home - 134/80.  He was rushing to get here and he believes that is why his BP is elevated.  GERD:  He is taking his medication daily as prescribed.  He denies any GERD symptoms and feels his GERD is well controlled.     Diabetes, obeisty: He is controlling his sugars with diet. He is compliant with a diabetic diet. He is no longer drinking soda.  He does drink tea 1/2 sweet - 1/2 unsweet.  He is very active, but not exercising regularly.  His sugars at home 110-150.     Medications and allergies reviewed with patient and updated if appropriate.  Patient Active Problem List   Diagnosis Date Noted  . Superficial skin infection 03/14/2017  . Infection of scalp 03/14/2017  . Status post total replacement of right hip 11/19/2016  . Unilateral primary osteoarthritis, right hip 10/13/2016  . Arthritis of right hip 09/13/2016  . Right hip pain 08/16/2016  . Generalized abdominal pain 04/06/2016  . Morbid obesity ([place]) 10/03/2015  . Fatty liver 10/03/2015  . SBO (small bowel obstruction) ([place]) 06/21/2014  . Lumbar radiculopathy 05/17/2014  . Microscopic hematuria 04/19/2014  . Plantar fasciitis of left foot 03/26/2014  . Diabetes mellitus with hyperglycemia ([place]) 03/23/2014  . Hypertriglyceridemia 03/23/2014  . Sleep apnea 07/31/2012  . Umbilical hernia 20/94/7096  . GERD 07/22/2009  . Essential hypertension 08/23/2007    Current Outpatient Prescriptions on File Prior to Visit    Medication Sig Dispense Refill  . amLODipine (NORVASC) 5 MG tablet TAKE 1 TABLET BY MOUTH EVERY DAY 30 tablet 0  . aspirin 81 MG chewable tablet Chew 1 tablet (81 mg total) by mouth 2 (two) times daily. 60 tablet 0  . labetalol (NORMODYNE) 300 MG tablet Take 1 tablet (300 mg total) by mouth 2 (two) times daily. Must keep appt for future refills 60 tablet 0  . losartan (COZAAR) 100 MG tablet TAKE 1 TABLET BY MOUTH EVERY DAY 30 tablet 0  . omeprazole (PRILOSEC) 20 MG capsule Take 20 mg by mouth daily.     No current facility-administered medications on file prior to visit.     Past Medical History:  Diagnosis Date  . Arthritis    "hands; right hip" (06/21/2014)  . Childhood asthma   . Gastroenteritis   . GERD (gastroesophageal reflux disease)   . Hypertension   . Hypertriglyceridemia   . Morbid obesity ([place])   . OSA on CPAP   . SBO (small bowel obstruction) ([place]) 05/2014  . Type II diabetes mellitus ([place]) dx'd ~ 03/2014   diet controlled     Past Surgical History:  Procedure Laterality Date  . HERNIA REPAIR    . LAPAROSCOPIC CHOLECYSTECTOMY  2005  . LAPAROSCOPIC INCISIONAL / UMBILICAL / VENTRAL HERNIA REPAIR  ~ 2007   2 yrs S/P chole  . LIPOMA EXCISION Left ~ 2010   [facility]  . TOTAL HIP  ARTHROPLASTY Right 11/19/2016   Procedure: RIGHT TOTAL HIP ARTHROPLASTY ANTERIOR APPROACH;  Surgeon: Mcarthur Rossetti, MD;  Location: [facility];  Service: Orthopedics;  Laterality: Right;  . WISDOM TOOTH EXTRACTION      Social History   Social History  . Marital status: Single    Spouse name: N/A  . Number of children: N/A  . Years of education: N/A   Social History Main Topics  . Smoking status: Current Every Day Smoker    Packs/day: 0.50    Years: 20.00    Types: Cigarettes  . Smokeless tobacco: Never Used  . Alcohol use 0.0 oz/week     Comment: 06/21/2014 "last drink was in 2012"  . Drug use: No  . Sexual activity: Yes   Other Topics Concern  . None   Social History  Narrative  . None    Family History  Problem Relation Age of Onset  . Heart disease Father        MI @ 78 in context of PNA  . Hypertension Father   . Leukemia Maternal Grandfather   . Prostate cancer Paternal Grandfather   . Diabetes Paternal Grandfather   . Stroke Neg Hx     Review of Systems  Constitutional: Negative for chills and fever.  Respiratory: Negative for cough, shortness of breath and wheezing.   Cardiovascular: Negative for chest pain, palpitations and leg swelling.  Neurological: Negative for light-headedness and headaches.       Objective:   Vitals:   03/23/17 1538  BP: (!) 150/90  Pulse: 97  Resp: 16  Temp: 97.8 F (36.6 C)  SpO2: 97%   Wt Readings from Last 3 Encounters:  03/23/17 (!) 357 lb (161.9 kg)  03/14/17 (!) 355 lb 6.4 oz (161.2 kg)  11/19/16 (!) 359 lb (162.8 kg)   Body mass index is 48.42 kg/m.   Physical Exam    Constitutional: Appears well-developed and well-nourished. No distress.  HENT:  Head: Normocephalic and atraumatic.  Neck: Neck supple. No tracheal deviation present. No thyromegaly present.  No cervical lymphadenopathy Cardiovascular: Normal rate, regular rhythm and normal heart sounds.   No murmur heard. No carotid bruit .  Mild b/l LE edema Pulmonary/Chest: Effort normal and breath sounds normal. No respiratory distress. No has no wheezes. No rales.  Skin: Skin is warm and dry. Not diaphoretic.  Psychiatric: Normal mood and affect. Behavior is normal.      Assessment & Plan:    See Problem List for Assessment and Plan of chronic medical problems.

## 2017-03-23 NOTE — Patient Instructions (Addendum)
You can take [phone] mcg of B12 a day.   Have a diabetic eye exam.   Test(s) ordered today. Your results will be released to Hannah (or called to you) after review, usually within 72hours after test completion. If any changes need to be made, you will be notified at that same time.  All other Health Maintenance issues reviewed.   All recommended immunizations and age-appropriate screenings are up-to-date or discussed.  No immunizations administered today.   Medications reviewed and updated.  No changes recommended at this time.  Your prescription(s) have been submitted to your pharmacy. Please take as directed and contact our office if you believe you are having problem(s) with the medication(s).   Please followup in 6 months

## 2017-03-24 ENCOUNTER — Encounter: Payer: Self-pay | Admitting: Internal Medicine

## 2017-03-24 NOTE — Assessment & Plan Note (Signed)
Check lipid panel  Stressed weight loss Regular exercise and healthy diet encouraged

## 2017-03-24 NOTE — Assessment & Plan Note (Signed)
Stressed weight loss Continue increased activity Healthy diet encouraged to work on losing 20-50 lbs

## 2017-03-24 NOTE — Assessment & Plan Note (Signed)
GERD controlled Continue daily medication  

## 2017-03-24 NOTE — Assessment & Plan Note (Signed)
BP elevated here today, but has been well controlled at home He is motivated to work on weight loss Continue increased activity Decrease portions, may try the keto diet Monitor BP at home - discussed goal Cmp, cbc

## 2017-03-24 NOTE — Assessment & Plan Note (Signed)
Did not tolerate metformin Controlling sugars with lifestyle Check a1c May need medication and may want to start medication to aid in weight loss - will see what a1c is first Continue increased activity Low sugar/carb diet

## 2017-04-03 ENCOUNTER — Other Ambulatory Visit: Payer: Self-pay | Admitting: Internal Medicine

## 2017-04-09 ENCOUNTER — Other Ambulatory Visit: Payer: Self-pay | Admitting: Internal Medicine

## 2017-07-18 IMAGING — DX DG HIP (WITH OR WITHOUT PELVIS) 2-3V*R*
2 series · 2 of 2 positions shown · non-contrast
Comparison: None.

CLINICAL DATA: Right hip pain, no known injury, initial encounter

EXAM:
DG HIP (WITH OR WITHOUT PELVIS) 2-3V RIGHT

[hip ap]
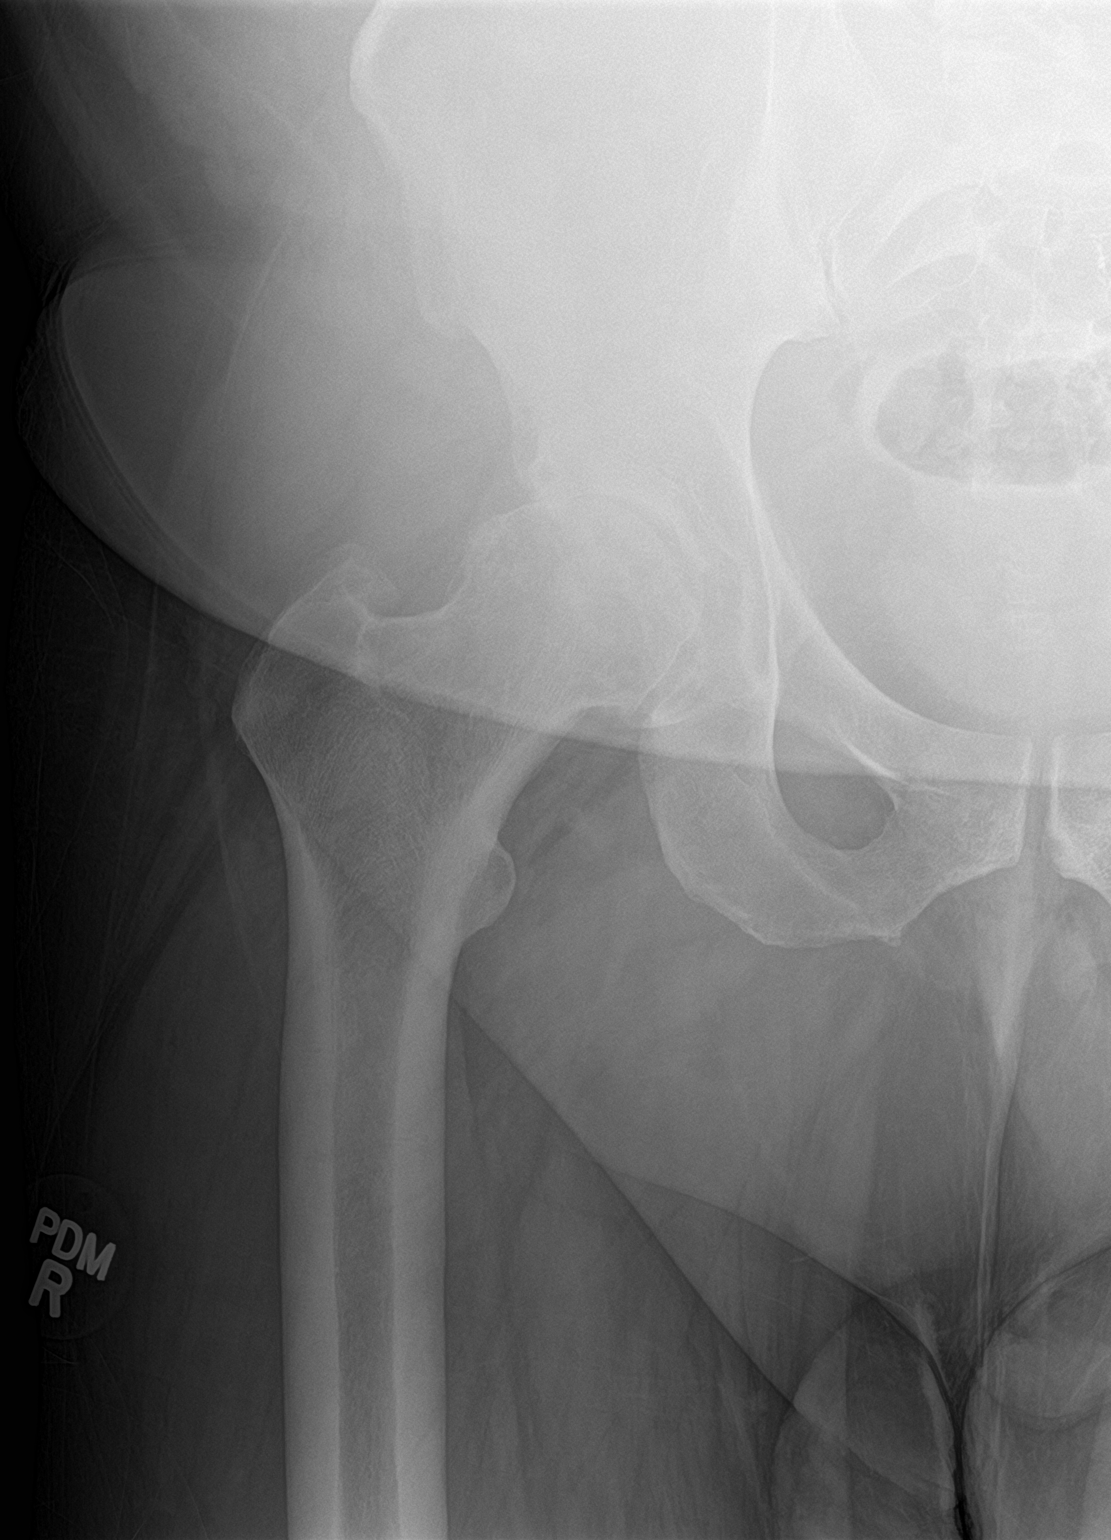

[hip lat]
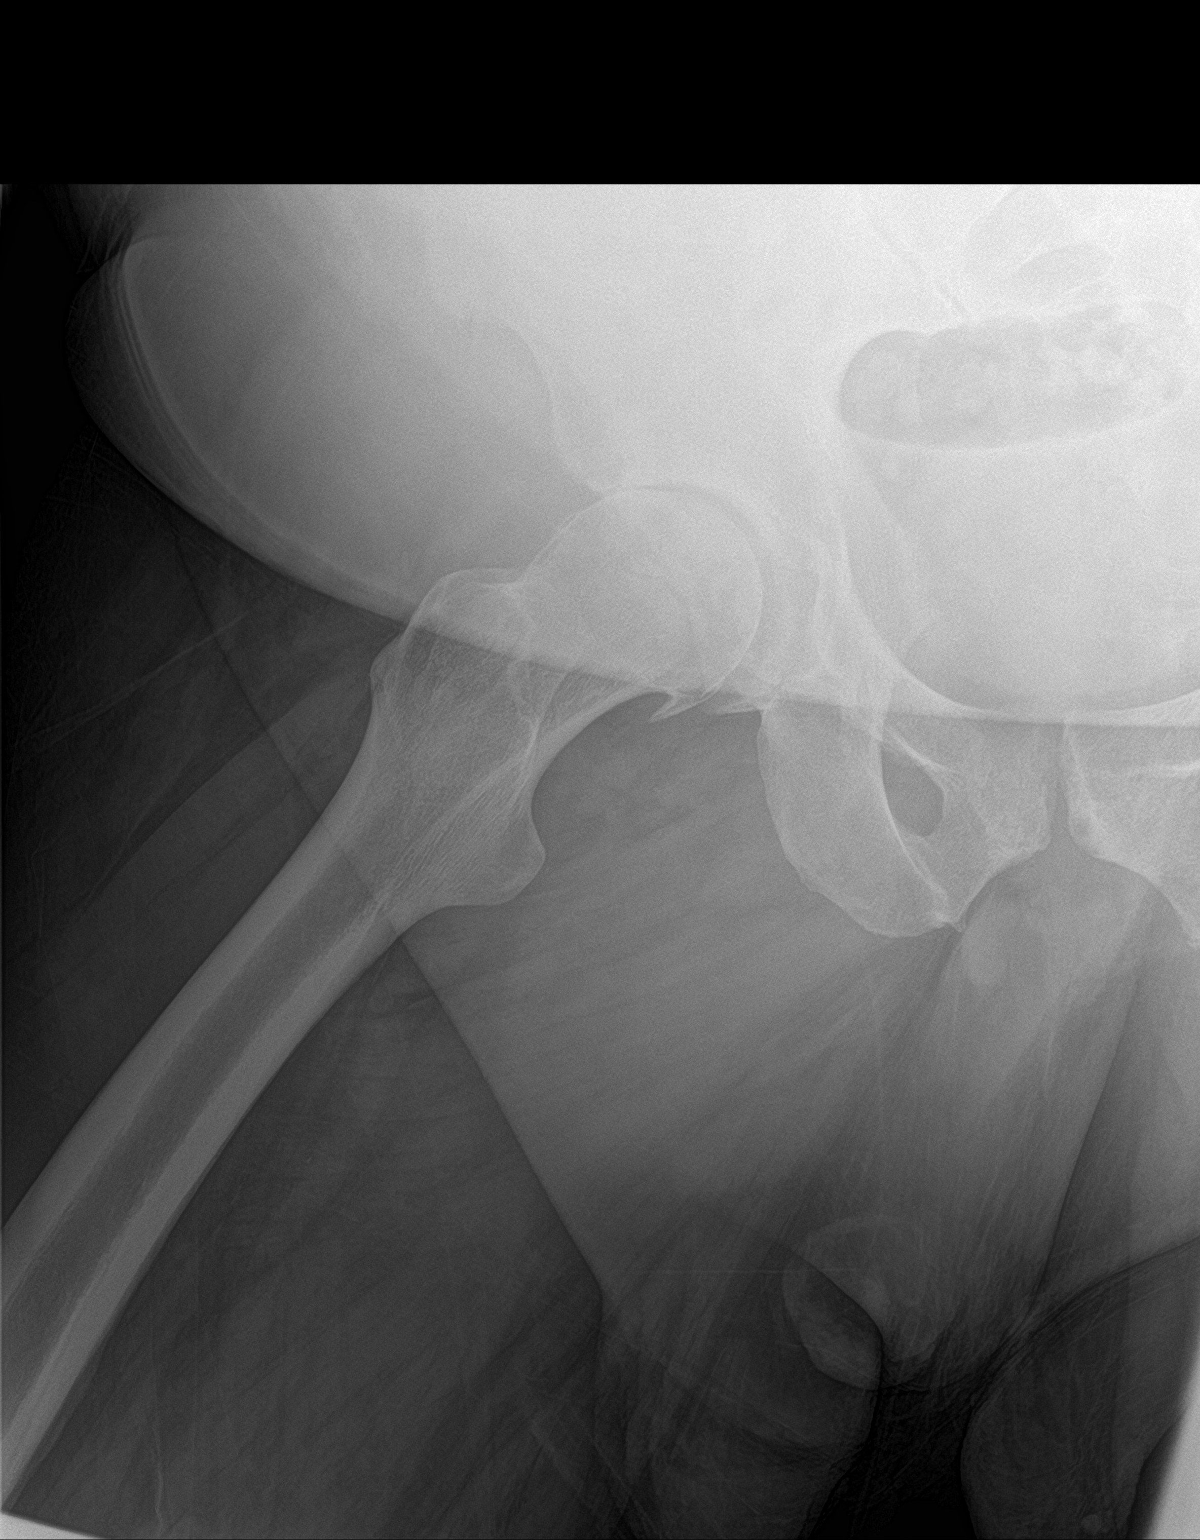

[2 of 2 positions shown; findings below may reference images not displayed]

FINDINGS: Degenerative changes of the right hip joint are noted with
remodeling of the acetabulum and femoral head. No joint effusion is
seen. No soft tissue abnormality is noted.
IMPRESSION: Degenerative changes without acute abnormality.

## 2017-08-03 ENCOUNTER — Ambulatory Visit (INDEPENDENT_AMBULATORY_CARE_PROVIDER_SITE_OTHER): Payer: BLUE CROSS/BLUE SHIELD | Admitting: Orthopaedic Surgery

## 2017-08-03 ENCOUNTER — Encounter (INDEPENDENT_AMBULATORY_CARE_PROVIDER_SITE_OTHER): Payer: Self-pay | Admitting: Orthopaedic Surgery

## 2017-08-03 ENCOUNTER — Ambulatory Visit (INDEPENDENT_AMBULATORY_CARE_PROVIDER_SITE_OTHER): Payer: BLUE CROSS/BLUE SHIELD

## 2017-08-03 DIAGNOSIS — Z96641 Presence of right artificial hip joint: Secondary | ICD-10-CM | POA: Diagnosis not present

## 2017-08-03 MED ORDER — METHYLPREDNISOLONE 4 MG PO TABS: ORAL_TABLET | ORAL | 0 refills | Status: DC

## 2017-08-03 NOTE — Progress Notes (Signed)
The patient is now 8 months status post a right total hip arthroplasty through direct anterior approach.  He is morbidly obese individual is been doing well other than some pain along his incision area and along his IT band and trochanteric areas where he points.  He walks with a minimal limp and without assistive device.  He has full range of motion of his right hip with no pain in the groin at all.  He has some pain to palpation of the trochanteric area and IT band.  An x-ray of his pelvis and right hip were obtained to see the implants well-seated.  I see no complicating features thus far but this definitely difficult to interpret given his obesity.  At this point we will have him alternate Biofreeze and Aspercreme with lidocaine massage in this area and work on stretching.  I will see him back in 6 months with a repeat AP and lateral of his right hip only.  I do not need to see his whole pelvis.

## 2017-09-20 ENCOUNTER — Other Ambulatory Visit (INDEPENDENT_AMBULATORY_CARE_PROVIDER_SITE_OTHER): Payer: BLUE CROSS/BLUE SHIELD

## 2017-09-20 ENCOUNTER — Ambulatory Visit (INDEPENDENT_AMBULATORY_CARE_PROVIDER_SITE_OTHER): Payer: BLUE CROSS/BLUE SHIELD | Admitting: Internal Medicine

## 2017-09-20 ENCOUNTER — Encounter: Payer: Self-pay | Admitting: Internal Medicine

## 2017-09-20 VITALS — BP 140/84 | HR 78 | Temp 98.5°F | Resp 16 | Wt 364.0 lb

## 2017-09-20 DIAGNOSIS — E781 Pure hyperglyceridemia: Secondary | ICD-10-CM

## 2017-09-20 DIAGNOSIS — Z23 Encounter for immunization: Secondary | ICD-10-CM

## 2017-09-20 DIAGNOSIS — E1165 Type 2 diabetes mellitus with hyperglycemia: Secondary | ICD-10-CM

## 2017-09-20 DIAGNOSIS — I10 Essential (primary) hypertension: Secondary | ICD-10-CM

## 2017-09-20 LAB — COMPREHENSIVE METABOLIC PANEL
ALK PHOS: 57 U/L (ref 39–117)
ALT: 36 U/L (ref 0–53)
AST: 22 U/L (ref 0–37)
Albumin: 3.8 g/dL (ref 3.5–5.2)
BILIRUBIN TOTAL: 0.4 mg/dL (ref 0.2–1.2)
BUN: 11 mg/dL (ref 6–23)
CO2: 31 mEq/L (ref 19–32)
CREATININE: 0.9 mg/dL (ref 0.40–1.50)
Calcium: 9.2 mg/dL (ref 8.4–10.5)
Chloride: 102 mEq/L (ref 96–112)
GFR: 95.01 mL/min (ref >60.00)
GLUCOSE: 194 mg/dL — AB (ref 70–99)
Potassium: 4.2 mEq/L (ref 3.5–5.1)
Sodium: 138 mEq/L (ref 135–145)
TOTAL PROTEIN: 6.6 g/dL (ref 6.0–8.3)

## 2017-09-20 LAB — LIPID PANEL
CHOLESTEROL: 115 mg/dL (ref 0–200)
HDL: 22.7 mg/dL — AB (ref >39.00)
NonHDL: 91.95
TRIGLYCERIDES: 277 mg/dL — AB (ref 0.0–149.0)
Total CHOL/HDL Ratio: 5
VLDL: 55.4 mg/dL — AB (ref 0.0–40.0)

## 2017-09-20 LAB — CBC WITH DIFFERENTIAL/PLATELET
BASOS ABS: 0.1 10*3/uL (ref 0.0–0.1)
Basophils Relative: 0.8 % (ref 0.0–3.0)
EOS ABS: 0.3 10*3/uL (ref 0.0–0.7)
Eosinophils Relative: 3.5 % (ref 0.0–5.0)
HCT: 41.8 % (ref 39.0–52.0)
Hemoglobin: 14.6 g/dL (ref 13.0–17.0)
LYMPHS ABS: 2.4 10*3/uL (ref 0.7–4.0)
Lymphocytes Relative: 31.8 % (ref 12.0–46.0)
MCHC: 34.9 g/dL (ref 30.0–36.0)
MCV: 86.5 fl (ref 78.0–100.0)
MONO ABS: 0.5 10*3/uL (ref 0.1–1.0)
Monocytes Relative: 6.9 % (ref 3.0–12.0)
NEUTROS PCT: 57 % (ref 43.0–77.0)
Neutro Abs: 4.4 10*3/uL (ref 1.4–7.7)
Platelets: 193 10*3/uL (ref 150.0–400.0)
RBC: 4.84 Mil/uL (ref 4.22–5.81)
RDW: 13.7 % (ref 11.5–15.5)
WBC: 7.6 10*3/uL (ref 4.0–10.5)

## 2017-09-20 LAB — HEMOGLOBIN A1C: Hgb A1c MFr Bld: 8.2 % — ABNORMAL HIGH (ref 4.6–6.5)

## 2017-09-20 LAB — MICROALBUMIN / CREATININE URINE RATIO
Creatinine,U: 100.5 mg/dL
Microalb Creat Ratio: 0.7 mg/g (ref 0.0–30.0)
Microalb, Ur: <0.7 mg/dL (ref 0.0–1.9)

## 2017-09-20 LAB — LDL CHOLESTEROL, DIRECT: LDL DIRECT: 66 mg/dL

## 2017-09-20 NOTE — Progress Notes (Signed)
Subjective:    Patient ID: Scott Avery, male    DOB: 05-04-1966, 52 y.o.   MRN: [id number]  HPI The patient is here for follow up.  Hypertension: He is taking his medication daily. He is compliant with a low sodium diet.  He denies chest pain, palpitations, frequent edema, shortness of breath and regular headaches. He is exercising regularly - he is walking more during the day.  He does not monitor his blood pressure at home.    Diabetes: He is controlling his sugars with diet. He is compliant with a diabetic diet. His sugar the other day was 127.  He is exercising  - walking more during the day and has a physical job. He checks his feet daily and denies foot lesions. He is not up-to-date with an ophthalmology examination, but has been meaning to make an appointment.  His last blood work showed that his sugars were not controlled and I did reach out to him to add another medication, but he never responded.  He feels he has done better with watching his diet.  Smoking: He has decreased the amount he is smoking.  He is only smoking 1 cigarette a day and is hoping to completely eliminate that.  Obesity: He has improved his diet and is trying to decrease his portions.  He has been moving more and feels like he has lost weight even though it is not reflected on the scale.  Hypertriglyceridemia: He is currently not on any cholesterol medication.  He feels he is eating fairly well and has been more active.  Medications and allergies reviewed with patient and updated if appropriate.  Patient Active Problem List   Diagnosis Date Noted  . Superficial skin infection 03/14/2017  . Infection of scalp 03/14/2017  . Status post total replacement of right hip 11/19/2016  . Unilateral primary osteoarthritis, right hip 10/13/2016  . Morbid obesity ([place]) 10/03/2015  . Fatty liver 10/03/2015  . SBO (small bowel obstruction) ([place]) 06/21/2014  . Lumbar radiculopathy 05/17/2014  . Microscopic hematuria  04/19/2014  . Plantar fasciitis of left foot 03/26/2014  . Diabetes mellitus with hyperglycemia ([place]) 03/23/2014  . Hypertriglyceridemia 03/23/2014  . Sleep apnea 07/31/2012  . Umbilical hernia 94/70/9628  . GERD 07/22/2009  . Essential hypertension 08/23/2007    Current Outpatient Medications on File Prior to Visit  Medication Sig Dispense Refill  . amLODipine (NORVASC) 5 MG tablet TAKE 1 TABLET BY MOUTH EVERY DAY 90 tablet 1  . aspirin 81 MG chewable tablet Chew 1 tablet (81 mg total) by mouth 2 (two) times daily. 60 tablet 0  . labetalol (NORMODYNE) 300 MG tablet Take 1 tablet (300 mg total) by mouth 2 (two) times daily. 60 tablet 5  . losartan (COZAAR) 100 MG tablet TAKE 1 TABLET BY MOUTH EVERY DAY 90 tablet 1  . omeprazole (PRILOSEC) 20 MG capsule Take 20 mg by mouth daily.     No current facility-administered medications on file prior to visit.     Past Medical History:  Diagnosis Date  . Arthritis    "hands; right hip" (06/21/2014)  . Childhood asthma   . Gastroenteritis   . GERD (gastroesophageal reflux disease)   . Hypertension   . Hypertriglyceridemia   . Morbid obesity ([place])   . OSA on CPAP   . SBO (small bowel obstruction) ([place]) 05/2014  . Type II diabetes mellitus ([place]) dx'd ~ 03/2014   diet controlled     Past Surgical History:  Procedure  Laterality Date  . HERNIA REPAIR    . LAPAROSCOPIC CHOLECYSTECTOMY  2005  . LAPAROSCOPIC INCISIONAL / UMBILICAL / VENTRAL HERNIA REPAIR  ~ 2007   2 yrs S/P chole  . LIPOMA EXCISION Left ~ 2010   [facility]  . TOTAL HIP ARTHROPLASTY Right 11/19/2016   Procedure: RIGHT TOTAL HIP ARTHROPLASTY ANTERIOR APPROACH;  Surgeon: Mcarthur Rossetti, MD;  Location: [facility];  Service: Orthopedics;  Laterality: Right;  . WISDOM TOOTH EXTRACTION      Social History   Socioeconomic History  . Marital status: Single    Spouse name: None  . Number of children: None  . Years of education: None  . Highest education level: None  Social  Needs  . Financial resource strain: None  . Food insecurity - worry: None  . Food insecurity - inability: None  . Transportation needs - medical: None  . Transportation needs - non-medical: None  Occupational History  . None  Tobacco Use  . Smoking status: Current Every Day Smoker    Packs/day: 0.50    Years: 20.00    Pack years: 10.00    Types: Cigarettes  . Smokeless tobacco: Never Used  Substance and Sexual Activity  . Alcohol use: Yes    Alcohol/week: 0.0 oz    Comment: 06/21/2014 "last drink was in 2012"  . Drug use: No  . Sexual activity: Yes  Other Topics Concern  . None  Social History Narrative  . None    Family History  Problem Relation Age of Onset  . Heart disease Father        MI @ 76 in context of PNA  . Hypertension Father   . Leukemia Maternal Grandfather   . Prostate cancer Paternal Grandfather   . Diabetes Paternal Grandfather   . Stroke Neg Hx     Review of Systems  Constitutional: Negative for chills and fever.  Respiratory: Negative for cough, shortness of breath and wheezing.   Cardiovascular: Positive for leg swelling (mild with too much salt intake). Negative for chest pain and palpitations.  Neurological: Negative for light-headedness and headaches.       Objective:   Vitals:   09/20/17 1548  BP: 140/84  Pulse: 78  Resp: 16  Temp: 98.5 F (36.9 C)  SpO2: 97%   BP Readings from Last 3 Encounters:  09/20/17 140/84  03/23/17 (!) 150/90  03/14/17 134/84    Wt Readings from Last 3 Encounters:  09/20/17 (!) 364 lb (165.1 kg)  03/23/17 (!) 357 lb (161.9 kg)  03/14/17 (!) 355 lb 6.4 oz (161.2 kg)   Body mass index is 49.37 kg/m.   Physical Exam    Constitutional: Appears well-developed and well-nourished. No distress.  HENT:  Head: Normocephalic and atraumatic.  Neck: Neck supple. No tracheal deviation present. No thyromegaly present.  No cervical lymphadenopathy Cardiovascular: Normal rate, regular rhythm and normal heart  sounds.   No murmur heard. No carotid bruit .  No edema Pulmonary/Chest: Effort normal and breath sounds normal. No respiratory distress. No has no wheezes. No rales.  Skin: Skin is warm and dry. Not diaphoretic.  Psychiatric: Normal mood and affect. Behavior is normal.   Diabetic Foot Exam - Simple   Simple Foot Form Diabetic Foot exam was performed with the following findings:  Yes 09/20/2017  4:12 PM  Visual Inspection No deformities, no ulcerations, no other skin breakdown bilaterally:  Yes Sensation Testing Intact to touch and monofilament testing bilaterally:  Yes Pulse Check Posterior Tibialis and  Dorsalis pulse intact bilaterally:  Yes Comments       Assessment & Plan:    See Problem List for Assessment and Plan of chronic medical problems.

## 2017-09-20 NOTE — Assessment & Plan Note (Addendum)
Diet controlled, did not tolerate metformin His last A1c was 7.7 and I did advise at that time to consider a different medication of metformin, but he never responded Has made some changes in his diet/lifestyle He is more active and feels he has lost weight, but that is not reflected on the scale Check A1c Would consider Ozempic if he is willing-we will see what A1c is first Will make eye exam appointment

## 2017-09-20 NOTE — Assessment & Plan Note (Signed)
Blood pressure slightly elevated ?  Good control or not Stressed the importance of increasing exercise and losing weight Continue low-sodium diet Will try holding off on medication adjustments for now, but if he is unable to lose weight we will need to adjust his medication at his next visit CMP, CBC

## 2017-09-20 NOTE — Patient Instructions (Addendum)
  Test(s) ordered today. Your results will be released to [place] (or called to you) after review, usually within 72hours after test completion. If any changes need to be made, you will be notified at that same time.  All other Health Maintenance issues reviewed.   All recommended immunizations and age-appropriate screenings are up-to-date or discussed.  tetanus immunization administered today.   Medications reviewed and updated.  No changes recommended at this time.    Please followup in 6 months

## 2017-09-20 NOTE — Assessment & Plan Note (Signed)
Stressed weight loss He feels he has made some positive lifestyle changes including improving his diet and becoming more active He feels his close is fitting him differently and feels he has lost weight despite it not being reflective on the scale today Continue weight loss efforts Decrease portions Follow-up in 6 months

## 2017-09-20 NOTE — Assessment & Plan Note (Signed)
Not currently on medication Check lipid panel, CMP today If cholesterol not ideally controlled will recommend medication

## 2017-09-26 ENCOUNTER — Other Ambulatory Visit: Payer: Self-pay | Admitting: Internal Medicine

## 2017-09-26 MED ORDER — SEMAGLUTIDE(0.25 OR 0.5MG/DOS) 2 MG/1.5ML ~~LOC~~ SOPN: PEN_INJECTOR | SUBCUTANEOUS | 5 refills | Status: DC

## 2017-09-27 ENCOUNTER — Telehealth: Payer: Self-pay | Admitting: Emergency Medicine

## 2017-09-27 NOTE — Telephone Encounter (Signed)
PA completed and approved from [place]. Pharmacy notified.

## 2017-09-28 ENCOUNTER — Telehealth: Payer: Self-pay | Admitting: Internal Medicine

## 2017-09-28 NOTE — Telephone Encounter (Signed)
Yes, ok to continue glucerna

## 2017-09-28 NOTE — Telephone Encounter (Signed)
Copied from [place] [id number]. Topic: Quick Communication - See Telephone Encounter >> Sep 28, 2017 12:28 PM Arletha Grippe wrote: CRM for notification. See Telephone encounter for:   09/28/17. Pt called - he is wanting to know your opinion on drinking glucerna shakes - he wants to know if it is ok to keep drinking them. He drinks one a day since Monday.  This is used to replace a meal, in place of breakfast. Then he has a scrambled egg or boiled egg at break time. Cb is [phone]

## 2017-09-28 NOTE — Telephone Encounter (Signed)
Patient has an active mychart, He should of been informed to send this message on there I feel.   Patient was last seen Feb 2019, Please advise.   Thank you.

## 2017-09-29 NOTE — Telephone Encounter (Signed)
Spoke with pt to inform.  

## 2017-10-07 ENCOUNTER — Other Ambulatory Visit: Payer: Self-pay | Admitting: Internal Medicine

## 2017-10-21 IMAGING — RF DG HIP (WITH PELVIS) OPERATIVE*R*
1 series · 2 of 2 positions shown · non-contrast
Comparison: 08/16/2016

CLINICAL DATA: Right hip replacements

EXAM:
OPERATIVE RIGHT HIP (WITH PELVIS IF PERFORMED) 2 VIEWS
TECHNIQUE: Fluoroscopic spot image(s) were submitted for interpretation
post-operatively.

[Series 1: run · 2 of 2 slices shown]
[im 1/2]
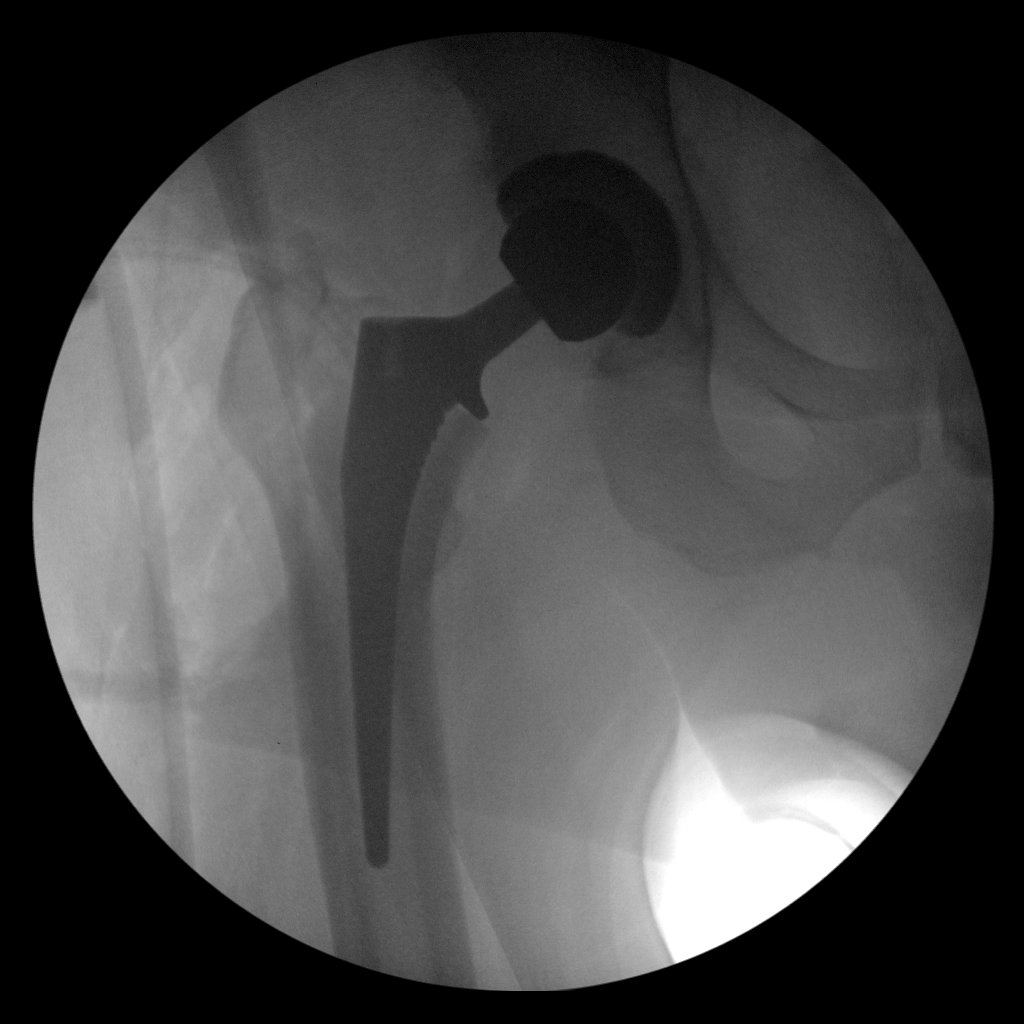
[im 2/2]
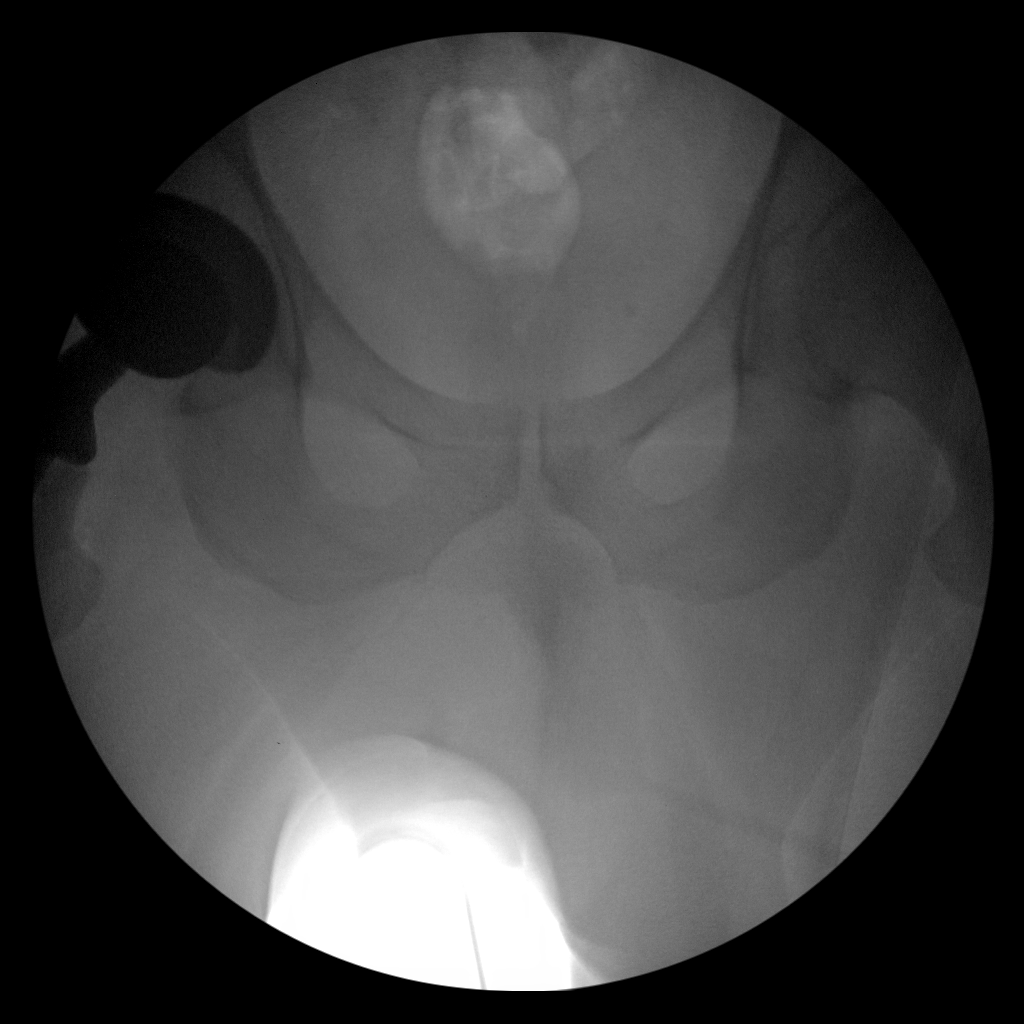

[2 of 2 positions shown; findings below may reference images not displayed]

FINDINGS: Changes of right hip replacement. Normal AP alignment. No hardware
or bony complicating feature.
IMPRESSION: Right hip replacement.  No visible complicating feature.

## 2017-10-31 ENCOUNTER — Telehealth: Payer: Self-pay | Admitting: Internal Medicine

## 2017-10-31 MED ORDER — SAXAGLIPTIN HCL 5 MG PO TABS: 5.0000 mg | ORAL_TABLET | Freq: Every day | ORAL | 5 refills | Status: DC

## 2017-10-31 NOTE — Telephone Encounter (Signed)
Copied from [place] [phone]. Topic: Quick Communication - See Telephone Encounter >> Oct 31, 2017  1:05 PM Antonieta Iba C wrote: CRM for notification. See Telephone encounter for: 10/31/17.   Pt called in because he says that Dr. Quay Burow started him on Semaglutide [facility]) 0.25 or 0.5 MG/DOSE SOPN, pt says that since starting medication he hasn't had much of an appetite and has had a lot of diarrhea. Pt says that he didn't take dosage yesterday due to concern that it causes. Pt would like to be advised further on medication. Pt says that its effecting his day to day.   CB: [phone]

## 2017-10-31 NOTE — Telephone Encounter (Signed)
onglyza sent to [facility]

## 2017-10-31 NOTE — Telephone Encounter (Signed)
Stop medication - both likely side effects from medication and should resolve after a few days.  We try a different medication - a pill he would take daily - depending on coverage - ongylza or farxiga.

## 2017-10-31 NOTE — Telephone Encounter (Signed)
Spoke with pt to inform. Pt is okay with starting new medication. Send to [facility]. Will most likely need PA for both.   Pt also would like to know how long he needs to wait before starting new medication.

## 2017-11-01 ENCOUNTER — Telehealth: Payer: Self-pay | Admitting: Emergency Medicine

## 2017-11-01 MED ORDER — LINAGLIPTIN 5 MG PO TABS: 5.0000 mg | ORAL_TABLET | Freq: Every day | ORAL | 5 refills | Status: DC

## 2017-11-01 NOTE — Telephone Encounter (Signed)
Spoke with pt to inform. Pt states he is feeling much better since stopping the Ozempic.

## 2017-11-01 NOTE — Telephone Encounter (Signed)
Insurance prefers [place] or [place].

## 2017-11-01 NOTE — Telephone Encounter (Signed)
PA completed for Tradjenta and approved through 11/02/18.  Notified pharmacy via fax. Unable to get through on phone.

## 2017-11-01 NOTE — Addendum Note (Signed)
Addended by: Terence Lux B on: 11/01/2017 01:10 PM   Modules accepted: Orders

## 2017-11-01 NOTE — Addendum Note (Signed)
Addended by: Binnie Rail on: 11/01/2017 12:31 PM   Modules accepted: Orders

## 2017-11-01 NOTE — Telephone Encounter (Signed)
tradjenta sent to [facility]

## 2018-02-08 ENCOUNTER — Ambulatory Visit (INDEPENDENT_AMBULATORY_CARE_PROVIDER_SITE_OTHER): Payer: BLUE CROSS/BLUE SHIELD

## 2018-02-08 ENCOUNTER — Encounter (INDEPENDENT_AMBULATORY_CARE_PROVIDER_SITE_OTHER): Payer: Self-pay | Admitting: Orthopaedic Surgery

## 2018-02-08 ENCOUNTER — Ambulatory Visit (INDEPENDENT_AMBULATORY_CARE_PROVIDER_SITE_OTHER): Payer: BLUE CROSS/BLUE SHIELD | Admitting: Orthopaedic Surgery

## 2018-02-08 DIAGNOSIS — Z96641 Presence of right artificial hip joint: Secondary | ICD-10-CM

## 2018-02-08 NOTE — Progress Notes (Signed)
The patient is well-known to me.  He is 15 months out from a right total hip arthroplasty.  He is someone with a BMI of almost 50.  His hip disease is quite terrible and performed anterior hip surgery on him.  He says that hip is doing well and he has no issues with it at all.  He has been dealing with some hamstring pain on his left side though he denies any groin pain on either left or right hip.  On exam I can put his right hip the range of motion that is full without difficulty or pain at all.  An AP and lateral of the right hip were obtained and show well-seated implant with no complicating features.  He does have pain at the distal hamstring area behind his knee but not knee joint itself and there is no knee swelling.  He said this is just mild.  At this point will follow-up as needed.  I talked about things that could work with helping his hamstrings.  He also knows that if he develops any type of issues with his right hip in terms of dull aching pain I would need to see and get new x-rays.  All question concerns were answered and addressed.

## 2018-03-21 NOTE — Progress Notes (Signed)
Subjective:    Patient ID: Scott Avery, male    DOB: 1966-06-27, 52 y.o.   MRN: [id number]  HPI He is here for a physical exam.   He has cut back on fried foods and bread.  He is drinking more water.  He has lost weight.    If he sneezes sometimes his whole body clenches and hurts.     Medications and allergies reviewed with patient and updated if appropriate.  Patient Active Problem List   Diagnosis Date Noted  . Status post total replacement of right hip 11/19/2016  . Unilateral primary osteoarthritis, right hip 10/13/2016  . Morbid obesity ([place]) 10/03/2015  . Fatty liver 10/03/2015  . SBO (small bowel obstruction) ([place]) 06/21/2014  . Lumbar radiculopathy 05/17/2014  . Microscopic hematuria 04/19/2014  . Plantar fasciitis of left foot 03/26/2014  . Diabetes mellitus with hyperglycemia ([place]) 03/23/2014  . Hypertriglyceridemia 03/23/2014  . Sleep apnea 07/31/2012  . Umbilical hernia 24/40/1027  . GERD 07/22/2009  . Essential hypertension 08/23/2007    Current Outpatient Medications on File Prior to Visit  Medication Sig Dispense Refill  . amLODipine (NORVASC) 5 MG tablet TAKE 1 TABLET BY MOUTH EVERY DAY 90 tablet 1  . aspirin 81 MG chewable tablet Chew 1 tablet (81 mg total) by mouth 2 (two) times daily. 60 tablet 0  . labetalol (NORMODYNE) 300 MG tablet TAKE 1 TABLET BY MOUTH TWICE A DAY 180 tablet 1  . linagliptin (TRADJENTA) 5 MG TABS tablet Take 1 tablet (5 mg total) by mouth daily. 30 tablet 5  . losartan (COZAAR) 100 MG tablet TAKE 1 TABLET BY MOUTH EVERY DAY 90 tablet 1  . omeprazole (PRILOSEC) 20 MG capsule Take 20 mg by mouth daily.     No current facility-administered medications on file prior to visit.     Past Medical History:  Diagnosis Date  . Arthritis    "hands; right hip" (06/21/2014)  . Childhood asthma   . Gastroenteritis   . GERD (gastroesophageal reflux disease)   . Hypertension   . Hypertriglyceridemia   . Morbid obesity ([place])   .  OSA on CPAP   . SBO (small bowel obstruction) ([place]) 05/2014  . Type II diabetes mellitus ([place]) dx'd ~ 03/2014   diet controlled     Past Surgical History:  Procedure Laterality Date  . HERNIA REPAIR    . LAPAROSCOPIC CHOLECYSTECTOMY  2005  . LAPAROSCOPIC INCISIONAL / UMBILICAL / VENTRAL HERNIA REPAIR  ~ 2007   2 yrs S/P chole  . LIPOMA EXCISION Left ~ 2010   [facility]  . TOTAL HIP ARTHROPLASTY Right 11/19/2016   Procedure: RIGHT TOTAL HIP ARTHROPLASTY ANTERIOR APPROACH;  Surgeon: Mcarthur Rossetti, MD;  Location: [facility];  Service: Orthopedics;  Laterality: Right;  . WISDOM TOOTH EXTRACTION      Social History   Socioeconomic History  . Marital status: Single    Spouse name: Not on file  . Number of children: Not on file  . Years of education: Not on file  . Highest education level: Not on file  Occupational History  . Not on file  Social Needs  . Financial resource strain: Not on file  . Food insecurity:    Worry: Not on file    Inability: Not on file  . Transportation needs:    Medical: Not on file    Non-medical: Not on file  Tobacco Use  . Smoking status: Current Every Day Smoker    Packs/day: 0.50  Years: 20.00    Pack years: 10.00    Types: Cigarettes  . Smokeless tobacco: Never Used  Substance and Sexual Activity  . Alcohol use: No    Alcohol/week: 0.0 standard drinks    Frequency: Never    Comment: 06/21/2014 "last drink was in 2012"  . Drug use: No  . Sexual activity: Yes  Lifestyle  . Physical activity:    Days per week: Not on file    Minutes per session: Not on file  . Stress: Not on file  Relationships  . Social connections:    Talks on phone: Not on file    Gets together: Not on file    Attends religious service: Not on file    Active member of club or organization: Not on file    Attends meetings of clubs or organizations: Not on file    Relationship status: Not on file  Other Topics Concern  . Not on file  Social History Narrative  .  Not on file    Family History  Problem Relation Age of Onset  . Heart disease Father        MI @ 29 in context of PNA  . Hypertension Father   . Leukemia Maternal Grandfather   . Prostate cancer Paternal Grandfather   . Diabetes Paternal Grandfather   . Stroke Neg Hx     Review of Systems  Constitutional: Negative for chills and fever.  Eyes: Negative for visual disturbance.  Respiratory: Negative for cough, shortness of breath and wheezing.   Cardiovascular: Positive for leg swelling (intermittent). Negative for chest pain and palpitations.  Gastrointestinal: Negative for abdominal pain, blood in stool, constipation, diarrhea and nausea.       GERD controlled  Genitourinary: Negative for difficulty urinating, dysuria and hematuria.  Musculoskeletal: Positive for arthralgias (occ - hands).  Skin: Negative for color change and rash.  Neurological: Negative for light-headedness and headaches.  Psychiatric/Behavioral: Negative for dysphoric mood. The patient is not nervous/anxious.        Objective:   Vitals:   03/22/18 1400  BP: 136/74  Pulse: 70  Resp: 18  Temp: 98.5 F (36.9 C)  SpO2: 97%   Filed Weights   03/22/18 1400  Weight: (!) 347 lb (157.4 kg)   Body mass index is 47.06 kg/m.  Wt Readings from Last 3 Encounters:  03/22/18 (!) 347 lb (157.4 kg)  09/20/17 (!) 364 lb (165.1 kg)  03/23/17 (!) 357 lb (161.9 kg)     Physical Exam Constitutional: He appears well-developed and well-nourished. No distress.  HENT:  Head: Normocephalic and atraumatic.  Right Ear: External ear normal.  Left Ear: External ear normal.  Mouth/Throat: Oropharynx is clear and moist.  Normal ear canals and TM b/l  Eyes: Conjunctivae and EOM are normal.  Neck: Neck supple. No tracheal deviation present. No thyromegaly present.  No carotid bruit  Cardiovascular: Normal rate, regular rhythm, normal heart sounds and intact distal pulses.   No murmur heard. Pulmonary/Chest: Effort  normal and breath sounds normal. No respiratory distress. He has no wheezes. He has no rales.  Abdominal: Soft. Ventral and umbilical hernia - reducible, non tender.  He exhibits no distension. There is no tenderness.  Genitourinary: deferred  Musculoskeletal: He exhibits no edema.  Lymphadenopathy:   He has no cervical adenopathy.  Skin: Skin is warm and dry. He is not diaphoretic.  Psychiatric: He has a normal mood and affect. His behavior is normal.  Assessment & Plan:   Physical exam: Screening blood work ordered Immunizations   pneumovax / prevnar due, flu vaccine due - deferred all Colonoscopy    - never had one - referral ordered Eye exams   Never had one -- advised to make an appointment for you EKG   Done 10/2016 Exercise  Very active during day with work - no other exercise Weight - has lost weight -- working on further weight loss, eating less bread and eating less fried foods Skin  No concerns, skin tags Substance abuse   Smoking - just about quit  See Problem List for Assessment and Plan of chronic medical problems.

## 2018-03-21 NOTE — Patient Instructions (Addendum)
Make a diabetic eye exam.  ( Dr Herbert Deaner, Dr Bing Plume, Dr Katy Fitch)  Test(s) ordered today. Your results will be released to Halma (or called to you) after review, usually within 72hours after test completion. If any changes need to be made, you will be notified at that same time.  All other Health Maintenance issues reviewed.   All recommended immunizations and age-appropriate screenings are up-to-date or discussed.  No immunizations administered today.   Medications reviewed and updated.  No changes recommended at this time.  A referral for GI was ordered for a colonoscopy.   Please followup in 6 months     Health Maintenance, Male A healthy lifestyle and preventive care is important for your health and wellness. Ask your health care provider about what schedule of regular examinations is right for you. What should I know about weight and diet? Eat a Healthy Diet  Eat plenty of vegetables, fruits, whole grains, low-fat dairy products, and lean protein.  Do not eat a lot of foods high in solid fats, added sugars, or salt.  Maintain a Healthy Weight Regular exercise can help you achieve or maintain a healthy weight. You should:  Do at least 150 minutes of exercise each week. The exercise should increase your heart rate and make you sweat (moderate-intensity exercise).  Do strength-training exercises at least twice a week.  Watch Your Levels of Cholesterol and Blood Lipids  Have your blood tested for lipids and cholesterol every 5 years starting at 50 years of age. If you are at high risk for heart disease, you should start having your blood tested when you are 50 years old. You may need to have your cholesterol levels checked more often if: ? Your lipid or cholesterol levels are high. ? You are older than 50 years of age. ? You are at high risk for heart disease.  What should I know about cancer screening? Many types of cancers can be detected early and may often be  prevented. Lung Cancer  You should be screened every year for lung cancer if: ? You are a current smoker who has smoked for at least 30 years. ? You are a former smoker who has quit within the past 15 years.  Talk to your health care provider about your screening options, when you should start screening, and how often you should be screened.  Colorectal Cancer  Routine colorectal cancer screening usually begins at 50 years of age and should be repeated every 5-10 years until you are 50 years old. You may need to be screened more often if early forms of precancerous polyps or small growths are found. Your health care provider may recommend screening at an earlier age if you have risk factors for colon cancer.  Your health care provider may recommend using home test kits to check for hidden blood in the stool.  A small camera at the end of a tube can be used to examine your colon (sigmoidoscopy or colonoscopy). This checks for the earliest forms of colorectal cancer.  Prostate and Testicular Cancer  Depending on your age and overall health, your health care provider may do certain tests to screen for prostate and testicular cancer.  Talk to your health care provider about any symptoms or concerns you have about testicular or prostate cancer.  Skin Cancer  Check your skin from head to toe regularly.  Tell your health care provider about any new moles or changes in moles, especially if: ? There is a change in  a mole's size, shape, or color. ? You have a mole that is larger than a pencil eraser.  Always use sunscreen. Apply sunscreen liberally and repeat throughout the day.  Protect yourself by wearing long sleeves, pants, a wide-brimmed hat, and sunglasses when outside.  What should I know about heart disease, diabetes, and high blood pressure?  If you are 71-29 years of age, have your blood pressure checked every 3-5 years. If you are 79 years of age or older, have your blood  pressure checked every year. You should have your blood pressure measured twice-once when you are at a hospital or clinic, and once when you are not at a hospital or clinic. Record the average of the two measurements. To check your blood pressure when you are not at a hospital or clinic, you can use: ? An automated blood pressure machine at a pharmacy. ? A home blood pressure monitor.  Talk to your health care provider about your target blood pressure.  If you are between 70-70 years old, ask your health care provider if you should take aspirin to prevent heart disease.  Have regular diabetes screenings by checking your fasting blood sugar level. ? If you are at a normal weight and have a low risk for diabetes, have this test once every three years after the age of 5. ? If you are overweight and have a high risk for diabetes, consider being tested at a younger age or more often.  A one-time screening for abdominal aortic aneurysm (AAA) by ultrasound is recommended for men aged 71-75 years who are current or former smokers. What should I know about preventing infection? Hepatitis B If you have a higher risk for hepatitis B, you should be screened for this virus. Talk with your health care provider to find out if you are at risk for hepatitis B infection. Hepatitis C Blood testing is recommended for:  Everyone born from [birth year] through 1963.  Anyone with known risk factors for hepatitis C.  Sexually Transmitted Diseases (STDs)  You should be screened each year for STDs including gonorrhea and chlamydia if: ? You are sexually active and are younger than 50 years of age. ? You are older than 50 years of age and your health care provider tells you that you are at risk for this type of infection. ? Your sexual activity has changed since you were last screened and you are at an increased risk for chlamydia or gonorrhea. Ask your health care provider if you are at risk.  Talk with your health  care provider about whether you are at high risk of being infected with HIV. Your health care provider may recommend a prescription medicine to help prevent HIV infection.  What else can I do?  Schedule regular health, dental, and eye exams.  Stay current with your vaccines (immunizations).  Do not use any tobacco products, such as cigarettes, chewing tobacco, and e-cigarettes. If you need help quitting, ask your health care provider.  Limit alcohol intake to no more than 2 drinks per day. One drink equals 12 ounces of beer, 5 ounces of wine, or 1 ounces of hard liquor.  Do not use street drugs.  Do not share needles.  Ask your health care provider for help if you need support or information about quitting drugs.  Tell your health care provider if you often feel depressed.  Tell your health care provider if you have ever been abused or do not feel safe at home. This information  is not intended to replace advice given to you by your health care provider. Make sure you discuss any questions you have with your health care provider. Document Released: 01/08/2008 Document Revised: 03/10/2016 Document Reviewed: 04/15/2015 Elsevier Interactive Patient Education  [employer].

## 2018-03-22 ENCOUNTER — Ambulatory Visit (INDEPENDENT_AMBULATORY_CARE_PROVIDER_SITE_OTHER): Payer: BLUE CROSS/BLUE SHIELD | Admitting: Internal Medicine

## 2018-03-22 ENCOUNTER — Other Ambulatory Visit (INDEPENDENT_AMBULATORY_CARE_PROVIDER_SITE_OTHER): Payer: BLUE CROSS/BLUE SHIELD

## 2018-03-22 ENCOUNTER — Encounter: Payer: Self-pay | Admitting: Internal Medicine

## 2018-03-22 VITALS — BP 136/74 | HR 70 | Temp 98.5°F | Resp 18 | Ht 72.0 in | Wt 347.0 lb

## 2018-03-22 DIAGNOSIS — E781 Pure hyperglyceridemia: Secondary | ICD-10-CM

## 2018-03-22 DIAGNOSIS — I10 Essential (primary) hypertension: Secondary | ICD-10-CM

## 2018-03-22 DIAGNOSIS — E1165 Type 2 diabetes mellitus with hyperglycemia: Secondary | ICD-10-CM

## 2018-03-22 DIAGNOSIS — Z0000 Encounter for general adult medical examination without abnormal findings: Secondary | ICD-10-CM

## 2018-03-22 DIAGNOSIS — Z1211 Encounter for screening for malignant neoplasm of colon: Secondary | ICD-10-CM

## 2018-03-22 DIAGNOSIS — M1611 Unilateral primary osteoarthritis, right hip: Secondary | ICD-10-CM

## 2018-03-22 DIAGNOSIS — K219 Gastro-esophageal reflux disease without esophagitis: Secondary | ICD-10-CM | POA: Diagnosis not present

## 2018-03-22 LAB — COMPREHENSIVE METABOLIC PANEL
ALBUMIN: 4.2 g/dL (ref 3.5–5.2)
ALK PHOS: 57 U/L (ref 39–117)
ALT: 31 U/L (ref 0–53)
AST: 15 U/L (ref 0–37)
BILIRUBIN TOTAL: 0.5 mg/dL (ref 0.2–1.2)
BUN: 17 mg/dL (ref 6–23)
CALCIUM: 9.3 mg/dL (ref 8.4–10.5)
CO2: 26 mEq/L (ref 19–32)
Chloride: 103 mEq/L (ref 96–112)
Creatinine, Ser: 0.9 mg/dL (ref 0.40–1.50)
GFR: 94.81 mL/min (ref >60.00)
GLUCOSE: 118 mg/dL — AB (ref 70–99)
POTASSIUM: 4 meq/L (ref 3.5–5.1)
Sodium: 138 mEq/L (ref 135–145)
TOTAL PROTEIN: 7.2 g/dL (ref 6.0–8.3)

## 2018-03-22 LAB — CBC WITH DIFFERENTIAL/PLATELET
BASOS ABS: 0.1 10*3/uL (ref 0.0–0.1)
Basophils Relative: 0.7 % (ref 0.0–3.0)
EOS ABS: 0.2 10*3/uL (ref 0.0–0.7)
Eosinophils Relative: 2.9 % (ref 0.0–5.0)
HEMATOCRIT: 42 % (ref 39.0–52.0)
Hemoglobin: 14.4 g/dL (ref 13.0–17.0)
LYMPHS PCT: 32.9 % (ref 12.0–46.0)
Lymphs Abs: 2.5 10*3/uL (ref 0.7–4.0)
MCHC: 34.3 g/dL (ref 30.0–36.0)
MCV: 85.8 fl (ref 78.0–100.0)
MONOS PCT: 6.2 % (ref 3.0–12.0)
Monocytes Absolute: 0.5 10*3/uL (ref 0.1–1.0)
Neutro Abs: 4.4 10*3/uL (ref 1.4–7.7)
Neutrophils Relative %: 57.3 % (ref 43.0–77.0)
PLATELETS: 190 10*3/uL (ref 150.0–400.0)
RBC: 4.9 Mil/uL (ref 4.22–5.81)
RDW: 13.4 % (ref 11.5–15.5)
WBC: 7.8 10*3/uL (ref 4.0–10.5)

## 2018-03-22 LAB — LIPID PANEL
Cholesterol: 117 mg/dL (ref 0–200)
HDL: 28.9 mg/dL — AB (ref >39.00)
LDL CALC: 60 mg/dL (ref 0–99)
NonHDL: 88.37
TRIGLYCERIDES: 143 mg/dL (ref 0.0–149.0)
Total CHOL/HDL Ratio: 4
VLDL: 28.6 mg/dL (ref 0.0–40.0)

## 2018-03-22 LAB — HEMOGLOBIN A1C: Hgb A1c MFr Bld: 7.4 % — ABNORMAL HIGH (ref 4.6–6.5)

## 2018-03-22 LAB — TSH: TSH: 1.79 u[IU]/mL (ref 0.35–4.50)

## 2018-03-22 NOTE — Assessment & Plan Note (Addendum)
Lab Results  Component Value Date   HGBA1C 8.2 (H) 09/20/2017    Has lost weight and improved diet Sugars 150's in AM, 110's in PM Check a1c Will adjust medication if needed

## 2018-03-22 NOTE — Assessment & Plan Note (Signed)
GERD controlled Continue daily medication  

## 2018-03-22 NOTE — Assessment & Plan Note (Signed)
Take ibuprofen as needed, not daily

## 2018-03-22 NOTE — Assessment & Plan Note (Signed)
Has lost weight with dietary changes - encouraged to continue his efforts Very active through work

## 2018-03-22 NOTE — Assessment & Plan Note (Addendum)
Check lipid panel,tsh Not on medication Has lost weight Regular exercise and healthy diet encouraged

## 2018-03-22 NOTE — Assessment & Plan Note (Signed)
BP well controlled Current regimen effective and well tolerated Continue current medications at current doses cmp  

## 2018-03-23 ENCOUNTER — Encounter: Payer: Self-pay | Admitting: Internal Medicine

## 2018-03-23 LAB — PSA, TOTAL AND FREE
PSA, % FREE: 25 % — AB (ref >25)
PSA, FREE: 0.1 ng/mL
PSA, Total: 0.4 ng/mL (ref <=4.0)

## 2018-04-05 ENCOUNTER — Other Ambulatory Visit: Payer: Self-pay | Admitting: Internal Medicine

## 2018-04-24 ENCOUNTER — Encounter: Payer: Self-pay | Admitting: Internal Medicine

## 2018-06-06 ENCOUNTER — Encounter: Payer: BLUE CROSS/BLUE SHIELD | Admitting: Internal Medicine

## 2018-09-15 ENCOUNTER — Telehealth: Payer: Self-pay | Admitting: Internal Medicine

## 2018-09-15 NOTE — Telephone Encounter (Signed)
Routing to dr burns, please advise, thanks 

## 2018-09-15 NOTE — Telephone Encounter (Signed)
Copied from [place] [id number]. Topic: Referral - Question >> Sep 15, 2018  1:48 PM Rayann Heman wrote: Reason for CRM: pt called and stated that he has moved to [place] and would like to know if stacy burns would referred him to someone in [place]. Please advise

## 2018-09-16 NOTE — Telephone Encounter (Signed)
Unfortunately, I do not know of any doctors in that area I can refer him to

## 2018-09-18 NOTE — Telephone Encounter (Signed)
Pt aware of response.  

## 2018-09-19 ENCOUNTER — Ambulatory Visit: Payer: BLUE CROSS/BLUE SHIELD | Admitting: Internal Medicine

## 2018-10-03 ENCOUNTER — Other Ambulatory Visit: Payer: Self-pay | Admitting: Internal Medicine

## 2018-10-04 ENCOUNTER — Other Ambulatory Visit: Payer: Self-pay | Admitting: Internal Medicine

## 2018-10-05 ENCOUNTER — Other Ambulatory Visit: Payer: Self-pay

## 2018-10-05 MED ORDER — LOSARTAN POTASSIUM 100 MG PO TABS: 100.0000 mg | ORAL_TABLET | Freq: Every day | ORAL | 0 refills | Status: DC

## 2018-10-10 ENCOUNTER — Other Ambulatory Visit: Payer: Self-pay | Admitting: Internal Medicine

## 2018-11-02 ENCOUNTER — Other Ambulatory Visit: Payer: Self-pay | Admitting: Internal Medicine

## 2018-11-02 NOTE — Telephone Encounter (Signed)
Virtual appt set up  °

## 2018-11-02 NOTE — Telephone Encounter (Signed)
Pt is due for follow up. Can we set him up for a virtual?

## 2018-11-05 NOTE — Progress Notes (Signed)
Virtual Visit via Video Note  I connected with Scott Avery on 11/06/18 at  1:30 PM EDT by a video enabled telemedicine application and verified that I am speaking with the correct person using two identifiers.   I discussed the limitations of evaluation and management by telemedicine and the availability of in person appointments. The patient expressed understanding and agreed to proceed.  The patient is currently at home and I am in the office.    No referring provider.    History of Present Illness: He is here for follow up of his chronic medical conditions.     He is active-he walks the dog and does a lot of walking at work.  He also does yard work.      Hypertension: He is taking his medication daily. He is compliant with a low sodium diet.  He denies chest pain, palpitations, edema, shortness of breath and regular headaches.  He does monitor his blood pressure at home.    Diabetes: He is taking his medication daily as prescribed. He is compliant with a diabetic diet.  He monitors his sugars and they have been running < 150.  Obesity: He is eating more healthy.  He is stop drinking soda and is only drinking water.  He is more active-does a lot of walking at work and walks the dogs.  He has lost approximately 5 pounds since I saw him last.  Hypertriglyceridemia: his last blood work showed improved triglycerides with weight loss.  He is eating healthy.  He is completely stopped drinking soda.  Tobacco abuse:  He was smoking 1 cig a day but officially quit last Nov 2019 and has not smoked since.  Social History   Socioeconomic History  . Marital status: Single    Spouse name: Not on file  . Number of children: Not on file  . Years of education: Not on file  . Highest education level: Not on file  Occupational History  . Not on file  Social Needs  . Financial resource strain: Not on file  . Food insecurity:    Worry: Not on file    Inability: Not on file  .  Transportation needs:    Medical: Not on file    Non-medical: Not on file  Tobacco Use  . Smoking status: Former Smoker    Packs/day: 0.50    Years: 20.00    Pack years: 10.00    Types: Cigarettes    Last attempt to quit: 05/26/2018    Years since quitting: 0.4  . Smokeless tobacco: Never Used  Substance and Sexual Activity  . Alcohol use: No    Alcohol/week: 0.0 standard drinks    Frequency: Never    Comment: 06/21/2014 "last drink was in 2012"  . Drug use: No  . Sexual activity: Yes  Lifestyle  . Physical activity:    Days per week: Not on file    Minutes per session: Not on file  . Stress: Not on file  Relationships  . Social connections:    Talks on phone: Not on file    Gets together: Not on file    Attends religious service: Not on file    Active member of club or organization: Not on file    Attends meetings of clubs or organizations: Not on file    Relationship status: Not on file  Other Topics Concern  . Not on file  Social History Narrative  . Not on file     Observations/Objective:  Appears well in NAD Glucose at home less than 150 Blood pressures at home averaging in the 130s/70s  Assessment and Plan:  See Problem List for Assessment and Plan of chronic medical problems.   Follow Up Instructions:    I discussed the assessment and treatment plan with the patient. The patient was provided an opportunity to ask questions and all were answered. The patient agreed with the plan and demonstrated an understanding of the instructions.   The patient was advised to call back or seek an in-person evaluation if the symptoms worsen or if the condition fails to improve as anticipated.  He has moved to [facility].  He will likely establish with a new primary care physician in the next few months.  In the meantime he will let me know if he needs anything.  Binnie Rail, MD

## 2018-11-06 ENCOUNTER — Ambulatory Visit (INDEPENDENT_AMBULATORY_CARE_PROVIDER_SITE_OTHER): Payer: Managed Care, Other (non HMO) | Admitting: Internal Medicine

## 2018-11-06 ENCOUNTER — Encounter: Payer: Self-pay | Admitting: Internal Medicine

## 2018-11-06 DIAGNOSIS — E781 Pure hyperglyceridemia: Secondary | ICD-10-CM

## 2018-11-06 DIAGNOSIS — E1165 Type 2 diabetes mellitus with hyperglycemia: Secondary | ICD-10-CM

## 2018-11-06 DIAGNOSIS — I10 Essential (primary) hypertension: Secondary | ICD-10-CM | POA: Diagnosis not present

## 2018-11-06 MED ORDER — LOSARTAN POTASSIUM 100 MG PO TABS: 100.0000 mg | ORAL_TABLET | Freq: Every day | ORAL | 1 refills | Status: DC

## 2018-11-06 NOTE — Assessment & Plan Note (Signed)
Controlled on diet Sugars less than 150 at home He has lost weight He is active Needs an A1c done, but due to the coronavirus situation and him moving to [place] this will have to wait He plans on establishing with a new PCP in the next couple of months in [facility]

## 2018-11-06 NOTE — Assessment & Plan Note (Signed)
Has lost at least 5 pounds since he was here last Encouraged him to continue weight loss efforts He is no longer drinking soda-only water He is eating more healthy and has been active

## 2018-11-06 NOTE — Assessment & Plan Note (Signed)
Improved with his last blood work with diet changes-he stop drinking soda He is also lost weight Continue increased activity and weight loss efforts

## 2018-11-06 NOTE — Assessment & Plan Note (Signed)
Well-controlled at his last visit and has been well controlled at home Has been working on weight loss Has changed his diet and is eating more healthy Continue current medications

## 2018-11-10 ENCOUNTER — Other Ambulatory Visit: Payer: Self-pay | Admitting: Internal Medicine

## 2018-11-19 ENCOUNTER — Other Ambulatory Visit: Payer: Self-pay | Admitting: Internal Medicine

## 2019-06-04 ENCOUNTER — Other Ambulatory Visit: Payer: Self-pay | Admitting: Internal Medicine

## 2019-06-12 ENCOUNTER — Other Ambulatory Visit: Payer: Self-pay | Admitting: Internal Medicine

## 2019-07-09 ENCOUNTER — Other Ambulatory Visit: Payer: Self-pay | Admitting: Internal Medicine

## 2019-08-01 ENCOUNTER — Other Ambulatory Visit: Payer: Self-pay | Admitting: Internal Medicine

## 2019-12-04 ENCOUNTER — Other Ambulatory Visit: Payer: Self-pay | Admitting: Internal Medicine

## 2019-12-21 ENCOUNTER — Other Ambulatory Visit: Payer: Self-pay | Admitting: Internal Medicine

## 2019-12-29 ENCOUNTER — Other Ambulatory Visit: Payer: Self-pay | Admitting: Internal Medicine
